# Patient Record
Sex: Male | Born: 1975 | Race: White | Hispanic: No | Marital: Married | Smoking: Never smoker
Health system: Southern US, Community
[De-identification: ages and names within clinical notes are randomized; demographics above are authoritative.]

## PROBLEM LIST (undated history)

## (undated) DIAGNOSIS — B3322 Viral myocarditis: Secondary | ICD-10-CM

## (undated) HISTORY — PX: FEMUR SURGERY: SHX943

---
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
## 2005-11-19 HISTORY — PX: FEMUR SURGERY: SHX943

## 2006-06-21 ENCOUNTER — Inpatient Hospital Stay (HOSPITAL_COMMUNITY): Admission: EM | Admit: 2006-06-21 | Discharge: 2006-06-26 | Payer: Self-pay | Admitting: Emergency Medicine

## 2011-11-05 ENCOUNTER — Emergency Department (HOSPITAL_COMMUNITY)
Admission: EM | Admit: 2011-11-05 | Discharge: 2011-11-05 | Disposition: A | Discharge status: Home / Self Care | Payer: BC Managed Care – PPO | Attending: Emergency Medicine | Admitting: Emergency Medicine

## 2011-11-05 ENCOUNTER — Emergency Department (HOSPITAL_COMMUNITY): Payer: BC Managed Care – PPO

## 2011-11-05 ENCOUNTER — Encounter: Payer: Self-pay | Admitting: Emergency Medicine

## 2011-11-05 ENCOUNTER — Other Ambulatory Visit: Payer: Self-pay

## 2011-11-05 DIAGNOSIS — R079 Chest pain, unspecified: Secondary | ICD-10-CM | POA: Insufficient documentation

## 2011-11-05 HISTORY — DX: Viral myocarditis: B33.22

## 2011-11-05 LAB — CBC
HCT: 42 % (ref 39.0–52.0)
MCHC: 35.2 g/dL (ref 30.0–36.0)
MCV: 84.7 fL (ref 78.0–100.0)
RBC: 4.96 MIL/uL (ref 4.22–5.81)
RDW: 12.7 % (ref 11.5–15.5)
WBC: 6.2 10*3/uL (ref 4.0–10.5)

## 2011-11-05 LAB — BASIC METABOLIC PANEL
Creatinine, Ser: 0.83 mg/dL (ref 0.50–1.35)
GFR calc non Af Amer: >90 mL/min (ref >90)
Glucose, Bld: 84 mg/dL (ref 70–99)
Potassium: 4.1 mEq/L (ref 3.5–5.1)

## 2011-11-05 LAB — DIFFERENTIAL
Basophils Relative: 0 % (ref 0–1)
Eosinophils Absolute: 0.1 10*3/uL (ref 0.0–0.7)
Lymphocytes Relative: 39 % (ref 12–46)
Lymphs Abs: 2.4 10*3/uL (ref 0.7–4.0)
Monocytes Absolute: 0.3 10*3/uL (ref 0.1–1.0)
Neutrophils Relative %: 55 % (ref 43–77)

## 2011-11-05 LAB — TROPONIN I: Troponin I: <0.3 ng/mL (ref <0.30)

## 2011-11-05 MED ORDER — KETOROLAC TROMETHAMINE 30 MG/ML IJ SOLN
30.0000 mg | Freq: Once | INTRAMUSCULAR | Status: AC
  Administered 2011-11-05: 30 mg via INTRAMUSCULAR
  Filled 2011-11-05: qty 1

## 2011-11-05 NOTE — ED Notes (Signed)
Notified EDP Belfi about patient pain of 8/10

## 2011-11-05 NOTE — ED Provider Notes (Signed)
History     CSN: [id number] Arrival date & time: 11/05/2011 10:58 AM   First MD Initiated Contact with Patient 11/05/11 1349      Chief Complaint  Patient presents with  . Chest Pain    (Consider location/radiation/quality/duration/timing/severity/associated sxs/prior treatment) HPI Comments: Pt presents with sharp pain to left side of chest, under left breast, nonradiating.  Last seconds, but comes and goes frequently.  Has had these pains off/on for about 15 years, but has been more frequent over last 3 days,  Same type of pain, but more frequent.  No pleuritic pain, no SOB.  No fevers/cough/congestion/abd pain.  Saw cardiologist about 10 years ago for same type of pain and had neg stress.  Patient is a 35 y.o. male presenting with chest pain. The history is provided by the patient.  Chest Pain The chest pain began 3 - 5 days ago. Pertinent negatives for primary symptoms include no fever, no fatigue, no shortness of breath, no cough, no abdominal pain, no nausea, no vomiting and no dizziness.  Pertinent negatives for associated symptoms include no diaphoresis, no numbness and no weakness.     Past Medical History  Diagnosis Date  . Viral myocarditis     Past Surgical History  Procedure Date  . Femur surgery     History reviewed. No pertinent family history.  History  Substance Use Topics  . Smoking status: Never Smoker   . Smokeless tobacco: Not on file  . Alcohol Use:       Review of Systems  Constitutional: Negative for fever, chills, diaphoresis and fatigue.  HENT: Negative for congestion, rhinorrhea and sneezing.   Eyes: Negative.   Respiratory: Negative for cough, chest tightness and shortness of breath.   Cardiovascular: Positive for chest pain. Negative for leg swelling.  Gastrointestinal: Negative for nausea, vomiting, abdominal pain, diarrhea and blood in stool.  Genitourinary: Negative for frequency, hematuria, flank pain and difficulty urinating.    Musculoskeletal: Negative for back pain and arthralgias.  Skin: Negative for rash.  Neurological: Negative for dizziness, speech difficulty, weakness, numbness and headaches.    Allergies  Penicillins  Home Medications   Current Outpatient Rx  Name Route Sig Dispense Refill  . ASPIRIN 325 MG PO TABS Oral Take 650 mg by mouth once.        BP 117/70  Pulse 108  Temp(Src) 97.9 F (36.6 C) (Oral)  Resp 18  SpO2 100%  Physical Exam  Constitutional: He is oriented to person, place, and time. He appears well-developed and well-nourished.  HENT:  Head: Normocephalic and atraumatic.  Eyes: Pupils are equal, round, and reactive to light.  Neck: Normal range of motion. Neck supple.  Cardiovascular: Normal rate, regular rhythm and normal heart sounds.   Pulmonary/Chest: Effort normal and breath sounds normal. No respiratory distress. He has no wheezes. He has no rales. He exhibits tenderness.       Mildly reproducible on palpation under left breast  Abdominal: Soft. Bowel sounds are normal. There is no tenderness. There is no rebound and no guarding.  Musculoskeletal: Normal range of motion. He exhibits no edema.  Lymphadenopathy:    He has no cervical adenopathy.  Neurological: He is alert and oriented to person, place, and time.  Skin: Skin is warm and dry. No rash noted.  Psychiatric: He has a normal mood and affect.    ED Course  Procedures (including critical care time)  Results for orders placed during the hospital encounter of 11/05/11  CBC  Component Value Range   WBC 6.2  4.0 - 10.5 (K/uL)   RBC 4.96  4.22 - 5.81 (MIL/uL)   Hemoglobin 14.8  13.0 - 17.0 (g/dL)   HCT 16.1  09.6 - 04.5 (%)   MCV 84.7  78.0 - 100.0 (fL)   MCH 29.8  26.0 - 34.0 (pg)   MCHC 35.2  30.0 - 36.0 (g/dL)   RDW 40.9  81.1 - 91.4 (%)   Platelets 305  150 - 400 (K/uL)  DIFFERENTIAL      Component Value Range   Neutrophils Relative 55  43 - 77 (%)   Neutro Abs 3.4  1.7 - 7.7 (K/uL)    Lymphocytes Relative 39  12 - 46 (%)   Lymphs Abs 2.4  0.7 - 4.0 (K/uL)   Monocytes Relative 5  3 - 12 (%)   Monocytes Absolute 0.3  0.1 - 1.0 (K/uL)   Eosinophils Relative 1  0 - 5 (%)   Eosinophils Absolute 0.1  0.0 - 0.7 (K/uL)   Basophils Relative 0  0 - 1 (%)   Basophils Absolute 0.0  0.0 - 0.1 (K/uL)  BASIC METABOLIC PANEL      Component Value Range   Sodium 139  135 - 145 (mEq/L)   Potassium 4.1  3.5 - 5.1 (mEq/L)   Chloride 103  96 - 112 (mEq/L)   CO2 27  19 - 32 (mEq/L)   Glucose, Bld 84  70 - 99 (mg/dL)   BUN 11  6 - 23 (mg/dL)   Creatinine, Ser 7.82  0.50 - 1.35 (mg/dL)   Calcium 9.8  8.4 - 95.6 (mg/dL)   GFR calc non Af Amer >90  >90 (mL/min)   GFR calc Af Amer >90  >90 (mL/min)  TROPONIN I      Component Value Range   Troponin I <0.30  <0.30 (ng/mL)   Dg Chest 2 View  11/05/2011  *RADIOLOGY REPORT*  Clinical Data: Chest pain.  CHEST - 2 VIEW  Comparison: 82,007.  Findings: Lungs are clear.  Heart and mediastinal structures are normal. The osseous structures are unremarkable.  IMPRESSION: No evidence for active chest disease.  Original Report Authenticated By: Rolla Plate, M.D.    Date: 11/05/2011  Rate: 63  Rhythm: normal sinus rhythm  QRS Axis: normal  Intervals: normal  ST/T Wave abnormalities: normal  Conduction Disutrbances:none  Narrative Interpretation:   Old EKG Reviewed: unchanged     1. Chest pain       MDM  Doubt ACS, nothing to suggest PE, nothing to suggest myocarditis.  Will have pt f/u with PMD at [facility]        Rolan Bucco, MD 11/05/11 [phone]

## 2011-11-05 NOTE — ED Notes (Signed)
Pt c/o chest pain onset 3 days ago, states has had pain to same area in past, hurting under L breast area, denies radiation of pain.

## 2011-11-05 NOTE — ED Notes (Signed)
Pt in no acute distress. 

## 2012-01-26 ENCOUNTER — Ambulatory Visit (INDEPENDENT_AMBULATORY_CARE_PROVIDER_SITE_OTHER): Payer: BC Managed Care – PPO | Admitting: Family Medicine

## 2012-01-26 VITALS — BP 120/81 | HR 69 | Temp 98.5°F | Resp 16 | Ht 66.5 in | Wt 159.0 lb

## 2012-01-26 DIAGNOSIS — J069 Acute upper respiratory infection, unspecified: Secondary | ICD-10-CM

## 2012-01-26 MED ORDER — FLUTICASONE PROPIONATE 50 MCG/ACT NA SUSP: 2.0000 | Freq: Every day | NASAL | Status: AC

## 2012-01-26 NOTE — Progress Notes (Signed)
  Subjective:    Patient ID: Gabriel Brooks, male    DOB: 1976-11-02, 36 y.o.   MRN: [id number]  HPI 36 yo male with URI symptoms for 3 days. Bodyaches, sinus congestion, sinus pressure, no fever.  No cough but very sore throat and headache.  No ear pain.  Ears do feel full.  Tried tylenol cold and sinus.     Review of Systems Negative except as per HPI     Objective:   Physical Exam  Constitutional: He appears well-developed. No distress.  HENT:  Right Ear: Tympanic membrane, external ear and ear canal normal. Tympanic membrane is not injected, not scarred, not perforated, not erythematous, not retracted and not bulging.  Left Ear: Tympanic membrane, external ear and ear canal normal. Tympanic membrane is not injected, not scarred, not perforated, not erythematous, not retracted and not bulging.  Nose: Mucosal edema present. No rhinorrhea. Right sinus exhibits no maxillary sinus tenderness and no frontal sinus tenderness. Left sinus exhibits no maxillary sinus tenderness and no frontal sinus tenderness.  Mouth/Throat: Uvula is midline and mucous membranes are normal. Posterior oropharyngeal erythema present. No oropharyngeal exudate or tonsillar abscesses.  Cardiovascular: Normal rate, regular rhythm, normal heart sounds and intact distal pulses.   No murmur heard. Pulmonary/Chest: Effort normal and breath sounds normal. No respiratory distress. He has no wheezes. He has no rales.  Lymphadenopathy:       Head (right side): No submandibular and no preauricular adenopathy present.       Head (left side): No submandibular and no preauricular adenopathy present.       Right cervical: No superficial cervical and no posterior cervical adenopathy present.      Left cervical: No superficial cervical and no posterior cervical adenopathy present.       Right: No supraclavicular adenopathy present.       Left: No supraclavicular adenopathy present.  Skin: Skin is warm and dry.            Assessment & Plan:  URI - flonase, sudafed, fluids.  If not starting to feel better in 48-72 hours can call and we can call out abx.

## 2012-08-13 ENCOUNTER — Telehealth: Payer: Self-pay

## 2012-08-13 NOTE — Telephone Encounter (Signed)
He had a Tdap in 2008 but that is all we have record of. Chart put back in PA box to make copies and send to patient.

## 2012-08-13 NOTE — Telephone Encounter (Signed)
Chart pulled to PA pool at nurse station DOS 06/16/11

## 2012-08-13 NOTE — Telephone Encounter (Signed)
Patient is calling to see if he has an immunization record with [place] because he needs to send his records to school. He cannot remember if he had immunization done here and would like to verify before he requests those records.  Best [phone]

## 2012-08-14 NOTE — Telephone Encounter (Signed)
Faxed to [phone] and [phone]

## 2015-02-01 ENCOUNTER — Ambulatory Visit: Payer: BLUE CROSS/BLUE SHIELD

## 2016-03-14 DIAGNOSIS — L84 Corns and callosities: Secondary | ICD-10-CM | POA: Diagnosis not present

## 2016-03-14 DIAGNOSIS — L821 Other seborrheic keratosis: Secondary | ICD-10-CM | POA: Diagnosis not present

## 2016-05-14 DIAGNOSIS — B070 Plantar wart: Secondary | ICD-10-CM | POA: Diagnosis not present

## 2016-05-14 DIAGNOSIS — M2141 Flat foot [pes planus] (acquired), right foot: Secondary | ICD-10-CM | POA: Diagnosis not present

## 2016-05-14 DIAGNOSIS — M2142 Flat foot [pes planus] (acquired), left foot: Secondary | ICD-10-CM | POA: Diagnosis not present

## 2016-05-14 MED FILL — CIMETIDINE 300 MG TABLET: 300 | 30 days supply | Qty: 90 | Fill #0

## 2016-06-04 DIAGNOSIS — M2141 Flat foot [pes planus] (acquired), right foot: Secondary | ICD-10-CM | POA: Diagnosis not present

## 2016-06-04 DIAGNOSIS — B070 Plantar wart: Secondary | ICD-10-CM | POA: Diagnosis not present

## 2016-06-04 DIAGNOSIS — M2142 Flat foot [pes planus] (acquired), left foot: Secondary | ICD-10-CM | POA: Diagnosis not present

## 2016-06-26 DIAGNOSIS — M76821 Posterior tibial tendinitis, right leg: Secondary | ICD-10-CM | POA: Diagnosis not present

## 2016-06-26 DIAGNOSIS — M2141 Flat foot [pes planus] (acquired), right foot: Secondary | ICD-10-CM | POA: Diagnosis not present

## 2016-06-26 DIAGNOSIS — M2142 Flat foot [pes planus] (acquired), left foot: Secondary | ICD-10-CM | POA: Diagnosis not present

## 2016-06-26 DIAGNOSIS — M76822 Posterior tibial tendinitis, left leg: Secondary | ICD-10-CM | POA: Diagnosis not present

## 2016-06-26 DIAGNOSIS — B070 Plantar wart: Secondary | ICD-10-CM | POA: Diagnosis not present

## 2016-12-16 ENCOUNTER — Ambulatory Visit (INDEPENDENT_AMBULATORY_CARE_PROVIDER_SITE_OTHER): Payer: Self-pay | Admitting: Family

## 2016-12-16 VITALS — BP 124/80 | HR 70 | Temp 98.4°F | Wt 160.6 lb

## 2016-12-16 DIAGNOSIS — M5010 Cervical disc disorder with radiculopathy, unspecified cervical region: Secondary | ICD-10-CM

## 2016-12-16 DIAGNOSIS — M542 Cervicalgia: Secondary | ICD-10-CM

## 2016-12-16 DIAGNOSIS — M25512 Pain in left shoulder: Secondary | ICD-10-CM

## 2016-12-16 MED ORDER — PREDNISONE 20 MG PO TABS: ORAL_TABLET | ORAL | 0 refills | Status: DC

## 2016-12-16 MED ORDER — HYDROCODONE-ACETAMINOPHEN 10-325 MG PO TABS: 1.0000 | ORAL_TABLET | Freq: Three times a day (TID) | ORAL | 0 refills | Status: DC | PRN

## 2016-12-16 NOTE — Progress Notes (Signed)
Subjective:     Patient ID: Gabriel Brooks, male   DOB: 1976-08-05, 41 y.o.   MRN: [id number]  HPI 41 year old male is in today with c/o neck, left shoulder, and upper back pain x 2 weeks and worsening. Denies any injury. Reports pain is a 10/10. Occasionally, he is able to find a position that allows the pain to decrease to 6/10 and tolerable. Pain worse at night. Feels that he is beginning to experience pain to the left upper arm recently that was not present previously. In his career, does some heavy lifting but no acute injury. Has taken Ibuprofen that helps but does not relieve his symptoms.   Review of Systems  Constitutional: Negative.   Respiratory: Negative.   Cardiovascular: Negative.   Gastrointestinal: Negative.   Musculoskeletal: Positive for arthralgias, back pain, neck pain and neck stiffness.  Skin: Negative.   Neurological: Negative.  Negative for dizziness and numbness.  Psychiatric/Behavioral: Negative.    Past Medical History:  Diagnosis Date  . Viral myocarditis     Social History   Social History  . Marital status: Single    Spouse name: N/A  . Number of children: N/A  . Years of education: N/A   Occupational History  . Not on file.   Social History Main Topics  . Smoking status: Never Smoker  . Smokeless tobacco: Never Used  . Alcohol use Yes  . Drug use: No  . Sexual activity: Not on file   Other Topics Concern  . Not on file   Social History Narrative  . No narrative on file    Past Surgical History:  Procedure Laterality Date  . FEMUR SURGERY      No family history on file.  Allergies  Allergen Reactions  . Penicillins Other (See Comments)    Unknown childhood reaction    No current outpatient prescriptions on file prior to visit.   No current facility-administered medications on file prior to visit.     BP 124/80 (BP Location: Left Arm, Patient Position: Sitting, Cuff Size: Normal)   Pulse 70   Temp 98.4 F (36.9 C) (Oral)    Wt 160 lb 9.6 oz (72.8 kg)   SpO2 96%   BMI 25.53 kg/m chart    Objective:   Physical Exam  Constitutional: He is oriented to person, place, and time. He appears well-developed and well-nourished.  Neck: Neck supple. Muscular tenderness present. No spinous process tenderness present. Decreased range of motion present.    Cardiovascular: Normal rate, regular rhythm and normal heart sounds.   Pulmonary/Chest: Effort normal and breath sounds normal.  Abdominal: Soft. Bowel sounds are normal.  Musculoskeletal: He exhibits no tenderness.  Neurological: He is oriented to person, place, and time. He has normal reflexes. No cranial nerve deficit.  Skin: Skin is warm and dry.  Psychiatric: He has a normal mood and affect.       Assessment:     Gabriel Brooks was seen today for shoulder pain, neck pain and back pain.  Diagnoses and all orders for this visit:  Neck pain  Cervical disc disorder with radiculopathy of cervical region  Acute pain of left shoulder  Other orders -     predniSONE (DELTASONE) 20 MG tablet; 3 tabs po qam x 3 days, 2 tabs po qam x 3 days, 2 tabs po qam x 3 days -     HYDROcodone-acetaminophen (NORCO) 10-325 MG tablet; Take 1 tablet by mouth every 8 (eight) hours as needed.  Plan:     If symptoms persist, will obtain an xray or the cervical spine. Ultimately may need Physical Therapy and an MRI. Conservatively, we will treat will medication, ice, rest. Advised patient to establish a relationship with a PCP/

## 2016-12-16 NOTE — Patient Instructions (Signed)
Cervical Radiculopathy Introduction Cervical radiculopathy means that a nerve in the neck is pinched or bruised. This can cause pain or loss of feeling (numbness) that runs from your neck to your arm and fingers. Follow these instructions at home: Managing pain  Take over-the-counter and prescription medicines only as told by your doctor.  If directed, put ice on the injured or painful area.  Put ice in a plastic bag.  Place a towel between your skin and the bag.  Leave the ice on for 20 minutes, 2-3 times per day.  If ice does not help, you can try using heat. Take a warm shower or warm bath, or use a heat pack as told by your doctor.  You may try a gentle neck and shoulder massage. Activity  Rest as needed. Follow instructions from your doctor about any activities to avoid.  Do exercises as told by your doctor or physical therapist. General instructions  If you were given a soft collar, wear it as told by your doctor.  Use a flat pillow when you sleep.  Keep all follow-up visits as told by your doctor. This is important. Contact a doctor if:  Your condition does not improve with treatment. Get help right away if:  Your pain gets worse and is not controlled with medicine.  You lose feeling or feel weak in your hand, arm, face, or leg.  You have a fever.  You have a stiff neck.  You cannot control when you poop or pee (have incontinence).  You have trouble with walking, balance, or talking. This information is not intended to replace advice given to you by your health care provider. Make sure you discuss any questions you have with your health care provider. Document Released: 10/25/2011 Document Revised: 04/12/2016 Document Reviewed: 12/30/2014  2017 Elsevier  

## 2016-12-23 DIAGNOSIS — S93492A Sprain of other ligament of left ankle, initial encounter: Secondary | ICD-10-CM | POA: Diagnosis not present

## 2018-08-13 MED FILL — BETAMETHASONE DP 0.05% CRM: 0.05 | 20 days supply | Qty: 45 | Fill #0

## 2019-04-29 MED FILL — FLUOCINONIDE 0.05% CREAM: 0.05 | 21 days supply | Qty: 60 | Fill #0

## 2019-09-15 ENCOUNTER — Other Ambulatory Visit: Payer: Self-pay

## 2019-09-15 DIAGNOSIS — Z20822 Contact with and (suspected) exposure to covid-19: Secondary | ICD-10-CM

## 2019-09-17 LAB — NOVEL CORONAVIRUS, NAA: SARS-CoV-2, NAA: NOT DETECTED

## 2020-01-30 ENCOUNTER — Ambulatory Visit: Payer: Self-pay | Attending: Internal Medicine

## 2020-01-30 DIAGNOSIS — Z23 Encounter for immunization: Secondary | ICD-10-CM

## 2020-01-30 NOTE — Progress Notes (Signed)
   Covid-19 Vaccination Clinic  Name:  Gabriel Brooks    MRN: [id number] DOB: 02-16-1976  01/30/2020  Gabriel Brooks was observed post Covid-19 immunization for 15 minutes without incident. He was provided with Vaccine Information Sheet and instruction to access the V-Safe system.   Gabriel Brooks was instructed to call 911 with any severe reactions post vaccine: Marland Kitchen Difficulty breathing  . Swelling of face and throat  . A fast heartbeat  . A bad rash all over body  . Dizziness and weakness   Immunizations Administered    Name Date Dose VIS Date Route   Pfizer COVID-19 Vaccine 01/30/2020 10:31 AM 0.3 mL 10/30/2019 Intramuscular   Manufacturer: [place]   Lot: [id number]   [place]: [id number]

## 2020-02-22 ENCOUNTER — Ambulatory Visit: Payer: Self-pay

## 2020-02-27 ENCOUNTER — Ambulatory Visit: Payer: Self-pay | Attending: Internal Medicine

## 2020-02-27 DIAGNOSIS — Z23 Encounter for immunization: Secondary | ICD-10-CM

## 2020-02-27 NOTE — Progress Notes (Signed)
   Covid-19 Vaccination Clinic  Name:  Gabriel Brooks    MRN: [id number] DOB: [birth year]  02/27/2020  [name] was observed post Covid-19 immunization for 15 minutes without incident. He was provided with Vaccine Information Sheet and instruction to access the V-Safe system.   [name] was instructed to call 911 with any severe reactions post vaccine: Marland Kitchen Difficulty breathing  . Swelling of face and throat  . A fast heartbeat  . A bad rash all over body  . Dizziness and weakness   Immunizations Administered    Name Date Dose VIS Date Route   Pfizer COVID-19 Vaccine 02/27/2020  8:58 AM 0.3 mL 10/30/2019 Intramuscular   Manufacturer: [place]   Lot: [id number]   [place]: [id number]

## 2020-05-05 DIAGNOSIS — Z0000 Encounter for general adult medical examination without abnormal findings: Secondary | ICD-10-CM | POA: Diagnosis not present

## 2020-05-05 DIAGNOSIS — E162 Hypoglycemia, unspecified: Secondary | ICD-10-CM | POA: Diagnosis not present

## 2020-05-05 DIAGNOSIS — Z1331 Encounter for screening for depression: Secondary | ICD-10-CM | POA: Diagnosis not present

## 2020-05-05 DIAGNOSIS — Z23 Encounter for immunization: Secondary | ICD-10-CM | POA: Diagnosis not present

## 2020-05-26 DIAGNOSIS — S338XXD Sprain of other parts of lumbar spine and pelvis, subsequent encounter: Secondary | ICD-10-CM | POA: Diagnosis not present

## 2020-05-30 ENCOUNTER — Ambulatory Visit (INDEPENDENT_AMBULATORY_CARE_PROVIDER_SITE_OTHER): Payer: 59 | Admitting: Adult Health

## 2020-05-30 ENCOUNTER — Encounter: Payer: Self-pay | Admitting: Adult Health

## 2020-05-30 ENCOUNTER — Other Ambulatory Visit: Payer: Self-pay

## 2020-05-30 VITALS — BP 136/85 | HR 63 | Wt 160.0 lb

## 2020-05-30 DIAGNOSIS — F909 Attention-deficit hyperactivity disorder, unspecified type: Secondary | ICD-10-CM

## 2020-05-30 MED ORDER — AMPHETAMINE-DEXTROAMPHETAMINE 20 MG PO TABS: 20.0000 mg | ORAL_TABLET | Freq: Every day | ORAL | 0 refills | Status: DC

## 2020-05-30 MED FILL — AMPHETAMINE-DEXTROAMPHETAMI: 20 | 30 days supply | Qty: 30 | Fill #0

## 2020-05-30 NOTE — Progress Notes (Signed)
[facility] MD/PA/NP Initial Note  05/30/2020 4:05 PM Gabriel Brooks  MRN:  [id number]  Chief Complaint:  Chief Complaint    ADHD      HPI:   Describes mood today as "ok". Pleasant. Mood symptoms - depression, anxiety, and irritability. Wanting to discuss ADHD. Stable interest and motivation. Taking medications as prescribed.  Energy levels stable - reports periods of low energy. Active, does not have a regular exercise routine.  Enjoys some usual interests and activities. Married. Lives with wife of 14 years and 2 children 11 and 8. Family local. Spending time with family. Appetite adequate. Weight stable - 160 pounds Sleeps well most nights. Averages 6 and 1/2 hours. Difficulties with focus and concentration. Has had struggles since childhood. Was unable to finish college with one class left. Has left work situations that required too many details. Not good at finishing the last 15% of tasks I need to complete. Stating "I am missing a lot". Completing tasks. Managing aspects of household. Works full-time - Nature conservation officer. Travels every other week. Denies SI or HI. Denies AH or VH.  Previous medication trials: Denies  Visit Diagnosis:    ICD-10-CM   1. Attention deficit hyperactivity disorder (ADHD), unspecified ADHD type  F90.9 amphetamine-dextroamphetamine (ADDERALL) 20 MG tablet    Past Psychiatric History: Denies psychiatric hospitalization.  Past Medical History:  Past Medical History:  Diagnosis Date  . Viral myocarditis     Past Surgical History:  Procedure Laterality Date  . FEMUR SURGERY      Family Psychiatric History: Denies psychiatric hospitalization.  Family History: No family history on file.  Social History:  Social History   Socioeconomic History  . Marital status: Married    Spouse name: Not on file  . Number of children: Not on file  . Years of education: Not on file  . Highest education level: Not on file  Occupational History   . Not on file  Tobacco Use  . Smoking status: Never Smoker  . Smokeless tobacco: Never Used  Substance and Sexual Activity  . Alcohol use: Yes  . Drug use: No  . Sexual activity: Not on file  Other Topics Concern  . Not on file  Social History Narrative  . Not on file   Social Determinants of Health   Financial Resource Strain:   . Difficulty of Paying Living Expenses:   Food Insecurity:   . Worried About Charity fundraiser in the Last Year:   . Arboriculturist in the Last Year:   Transportation Needs:   . Film/video editor (Medical):   Marland Kitchen Lack of Transportation (Non-Medical):   Physical Activity:   . Days of Exercise per Week:   . Minutes of Exercise per Session:   Stress:   . Feeling of Stress :   Social Connections:   . Frequency of Communication with Friends and Family:   . Frequency of Social Gatherings with Friends and Family:   . Attends Religious Services:   . Active Member of Clubs or Organizations:   . Attends Archivist Meetings:   Marland Kitchen Marital Status:     Allergies:  Allergies  Allergen Reactions  . Penicillins Other (See Comments)    Unknown childhood reaction    Metabolic Disorder Labs: No results found for: HGBA1C, MPG No results found for: PROLACTIN No results found for: CHOL, TRIG, HDL, CHOLHDL, VLDL, LDLCALC No results found for: TSH  Therapeutic Level Labs: No results found  for: LITHIUM No results found for: VALPROATE No components found for:  CBMZ  Current Medications: Current Outpatient Medications  Medication Sig Dispense Refill  . amphetamine-dextroamphetamine (ADDERALL) 20 MG tablet Take 1 tablet (20 mg total) by mouth daily. 30 tablet 0  . HYDROcodone-acetaminophen (NORCO) 10-325 MG tablet Take 1 tablet by mouth every 8 (eight) hours as needed. 30 tablet 0  . predniSONE (DELTASONE) 20 MG tablet 3 tabs po qam x 3 days, 2 tabs po qam x 3 days, 2 tabs po qam x 3 days 18 tablet 0   No current facility-administered  medications for this visit.    Medication Side Effects: none  Orders placed this visit:  No orders of the defined types were placed in this encounter.   Psychiatric Specialty Exam:  Review of Systems  Musculoskeletal: Negative for gait problem.  Neurological: Negative for tremors.  Psychiatric/Behavioral:       Please refer to HPI    Blood pressure 136/85, pulse 63, weight 160 lb (72.6 kg).Body mass index is 25.44 kg/m.  General Appearance: Casual, Neat and Well Groomed  Eye Contact:  Good  Speech:  Clear and Coherent and Normal Rate  Volume:  Normal  Mood:  Euthymic  Affect:  Appropriate and Congruent  Thought Process:  Coherent and Descriptions of Associations: Intact  Orientation:  Full (Time, Place, and Person)  Thought Content: Logical   Suicidal Thoughts:  No  Homicidal Thoughts:  No  Memory:  WNL  Judgement:  Good  Insight:  Good  Psychomotor Activity:  Normal  Concentration:  Concentration: Good  Recall:  Good  Fund of Knowledge: Good  Language: Good  Assets:  Communication Skills Desire for Improvement Financial Resources/Insurance Housing Intimacy Leisure Time Physical Health Resilience Social Support Talents/Skills Transportation Vocational/Educational  ADL's:  Intact  Cognition: WNL  Prognosis:  Good   Screenings: [facility] ADHD testing completed.  Receiving Psychotherapy: No   Treatment Plan/Recommendations:  Plan:  PDMP reviewed  1. Add Adderall 20mg  daily  [facility] ADHD test 44/58 - ADHD likely  Meets DSM 5 ADHD criteria for ADHD  RTC 4 weeks  Patient advised to contact office with any questions, adverse effects, or acute worsening in signs and symptoms.  Discussed potential benefits, risks, and side effects of stimulants with patient to include increased heart rate, palpitations, insomnia, increased anxiety, increased irritability, or decreased appetite.  Instructed patient to contact office if experiencing any  significant tolerability issues.  Greater than 50% of face to face time with patient was spent on counseling and coordination of care. We discussed ADHD, medications, diet, vitamin supplementation, and ADDitude website. ADHD testing completed during visit.     Aloha Gell, NP

## 2020-05-31 DIAGNOSIS — S338XXD Sprain of other parts of lumbar spine and pelvis, subsequent encounter: Secondary | ICD-10-CM | POA: Diagnosis not present

## 2020-06-06 DIAGNOSIS — S338XXD Sprain of other parts of lumbar spine and pelvis, subsequent encounter: Secondary | ICD-10-CM | POA: Diagnosis not present

## 2020-06-13 DIAGNOSIS — S338XXD Sprain of other parts of lumbar spine and pelvis, subsequent encounter: Secondary | ICD-10-CM | POA: Diagnosis not present

## 2020-06-16 ENCOUNTER — Emergency Department: Payer: 59

## 2020-06-16 ENCOUNTER — Emergency Department
Admission: EM | Admit: 2020-06-16 | Discharge: 2020-06-16 | Disposition: A | Discharge status: Home / Self Care | Payer: 59 | Attending: Emergency Medicine | Admitting: Emergency Medicine

## 2020-06-16 ENCOUNTER — Other Ambulatory Visit: Payer: Self-pay

## 2020-06-16 DIAGNOSIS — N200 Calculus of kidney: Secondary | ICD-10-CM | POA: Insufficient documentation

## 2020-06-16 DIAGNOSIS — N50819 Testicular pain, unspecified: Secondary | ICD-10-CM | POA: Diagnosis not present

## 2020-06-16 DIAGNOSIS — N4289 Other specified disorders of prostate: Secondary | ICD-10-CM | POA: Diagnosis not present

## 2020-06-16 DIAGNOSIS — I861 Scrotal varices: Secondary | ICD-10-CM | POA: Diagnosis not present

## 2020-06-16 DIAGNOSIS — N132 Hydronephrosis with renal and ureteral calculous obstruction: Secondary | ICD-10-CM | POA: Diagnosis not present

## 2020-06-16 DIAGNOSIS — Z79899 Other long term (current) drug therapy: Secondary | ICD-10-CM | POA: Diagnosis not present

## 2020-06-16 DIAGNOSIS — N50811 Right testicular pain: Secondary | ICD-10-CM | POA: Diagnosis not present

## 2020-06-16 DIAGNOSIS — R1032 Left lower quadrant pain: Secondary | ICD-10-CM | POA: Diagnosis present

## 2020-06-16 DIAGNOSIS — N50812 Left testicular pain: Secondary | ICD-10-CM | POA: Diagnosis not present

## 2020-06-16 LAB — COMPREHENSIVE METABOLIC PANEL
ALT: 33 U/L (ref 0–44)
AST: 25 U/L (ref 15–41)
Albumin: 4.6 g/dL (ref 3.5–5.0)
Alkaline Phosphatase: 40 U/L (ref 38–126)
Anion gap: 10 (ref 5–15)
BUN: 16 mg/dL (ref 6–20)
CO2: 22 mmol/L (ref 22–32)
Calcium: 9 mg/dL (ref 8.9–10.3)
Chloride: 108 mmol/L (ref 98–111)
Creatinine, Ser: 0.96 mg/dL (ref 0.61–1.24)
GFR calc Af Amer: >60 mL/min (ref >60)
GFR calc non Af Amer: >60 mL/min (ref >60)
Glucose, Bld: 103 mg/dL — ABNORMAL HIGH (ref 70–99)
Potassium: 3.5 mmol/L (ref 3.5–5.1)
Sodium: 140 mmol/L (ref 135–145)
Total Bilirubin: 0.7 mg/dL (ref 0.3–1.2)
Total Protein: 7.6 g/dL (ref 6.5–8.1)

## 2020-06-16 LAB — URINALYSIS, COMPLETE (UACMP) WITH MICROSCOPIC
Bacteria, UA: NONE SEEN
Bilirubin Urine: NEGATIVE
Glucose, UA: NEGATIVE mg/dL
Hgb urine dipstick: NEGATIVE
Ketones, ur: NEGATIVE mg/dL
Leukocytes,Ua: NEGATIVE
Nitrite: NEGATIVE
Protein, ur: NEGATIVE mg/dL
Specific Gravity, Urine: 1.015 (ref 1.005–1.030)
Squamous Epithelial / LPF: NONE SEEN (ref 0–5)
pH: 6 (ref 5.0–8.0)

## 2020-06-16 LAB — CBC
HCT: 39.3 % (ref 39.0–52.0)
Hemoglobin: 14.1 g/dL (ref 13.0–17.0)
MCH: 30.1 pg (ref 26.0–34.0)
MCHC: 35.9 g/dL (ref 30.0–36.0)
MCV: 84 fL (ref 80.0–100.0)
Platelets: 347 10*3/uL (ref 150–400)
RBC: 4.68 MIL/uL (ref 4.22–5.81)
RDW: 12.6 % (ref 11.5–15.5)
WBC: 11.6 10*3/uL — ABNORMAL HIGH (ref 4.0–10.5)
nRBC: 0 % (ref 0.0–0.2)

## 2020-06-16 LAB — LIPASE, BLOOD: Lipase: 41 U/L (ref 11–51)

## 2020-06-16 MED ORDER — ONDANSETRON HCL 4 MG PO TABS: 4.0000 mg | ORAL_TABLET | Freq: Three times a day (TID) | ORAL | 0 refills | Status: DC | PRN

## 2020-06-16 MED ORDER — SODIUM CHLORIDE 0.9% FLUSH
3.0000 mL | Freq: Once | INTRAVENOUS | Status: AC
  Administered 2020-06-16: 3 mL via INTRAVENOUS

## 2020-06-16 MED ORDER — ONDANSETRON HCL 4 MG/2ML IJ SOLN
4.0000 mg | Freq: Once | INTRAMUSCULAR | Status: AC
  Administered 2020-06-16: 4 mg via INTRAVENOUS
  Filled 2020-06-16: qty 2

## 2020-06-16 MED ORDER — FENTANYL CITRATE (PF) 100 MCG/2ML IJ SOLN
50.0000 ug | INTRAMUSCULAR | Status: DC | PRN
  Administered 2020-06-16: 50 ug via INTRAVENOUS
  Filled 2020-06-16: qty 2

## 2020-06-16 MED ORDER — LACTATED RINGERS IV BOLUS
1000.0000 mL | Freq: Once | INTRAVENOUS | Status: AC
  Administered 2020-06-16: 1000 mL via INTRAVENOUS

## 2020-06-16 MED ORDER — TAMSULOSIN HCL 0.4 MG PO CAPS: 0.4000 mg | ORAL_CAPSULE | Freq: Every day | ORAL | 0 refills | Status: AC

## 2020-06-16 MED ORDER — HYDROMORPHONE HCL 1 MG/ML IJ SOLN
0.5000 mg | Freq: Once | INTRAMUSCULAR | Status: AC
  Administered 2020-06-16: 0.5 mg via INTRAVENOUS
  Filled 2020-06-16: qty 1

## 2020-06-16 MED ORDER — KETOROLAC TROMETHAMINE 30 MG/ML IJ SOLN
30.0000 mg | Freq: Once | INTRAMUSCULAR | Status: AC
  Administered 2020-06-16: 30 mg via INTRAVENOUS
  Filled 2020-06-16: qty 1

## 2020-06-16 MED ORDER — OXYCODONE-ACETAMINOPHEN 5-325 MG PO TABS: 1.0000 | ORAL_TABLET | ORAL | 0 refills | Status: DC | PRN

## 2020-06-16 NOTE — ED Triage Notes (Signed)
Pt arrives via POV for reports of left lower abdominal pain radiating to groin that started 30 mins ago. Pt denies dysuria. Pt bent over moaning when walking to triage room, pt appears to be in severe pain. Skin warm and dry

## 2020-06-16 NOTE — ED Notes (Signed)
Pt at CT

## 2020-06-16 NOTE — ED Provider Notes (Signed)
[facility] Emergency Department Provider Note  ____________________________________________   First MD Initiated Contact with Patient 06/16/20 1656     (approximate)  I have reviewed the triage vital signs and the nursing notes.   HISTORY  Chief Complaint Abdominal Pain   HPI Gabriel Brooks is a 44 y.o. male with a past medical history of a femur fracture presents for assessment of acute left lower quadrant abdominal pain that began approximately an hour prior to arrival that he felt was rating into his groin.  He states he got better after he received p.o. analgesia in triage but otherwise he describes it as almost excruciating pain is ever felt and denies any other clear alleviating or factors prior to arrival.  States it also felt like it was radiating to his left lower back at times in the flank.  Endorses some hematuria 3 weeks ago but otherwise has not had any blood in his urine, dysuria,  abdominal pain but does state he has been going to PT for pelvic floor muscle weakness and has had some discomfort with that.  No recent fevers, chills, cough, nausea, diarrhea, rash, recent traumatic injuries.  He states he vomited once secondary to the pain has not since.  This was nonbloody nonbilious.  Denies any specific abdominal or GU trauma.  No recent lesions or rashes or penile discharge.  No prior similar episodes.  No other clearly fitting aggravating factors.         Past Medical History:  Diagnosis Date  . Viral myocarditis     There are no problems to display for this patient.   Past Surgical History:  Procedure Laterality Date  . FEMUR SURGERY      Prior to Admission medications   Medication Sig Start Date End Date Taking? Authorizing Provider  amphetamine-dextroamphetamine (ADDERALL) 20 MG tablet Take 1 tablet (20 mg total) by mouth daily. 05/30/20   Mozingo, Berdie Ogren, NP  ondansetron (ZOFRAN) 4 MG tablet Take 1 tablet (4 mg total) by  mouth every 8 (eight) hours as needed for up to 10 doses for nausea or vomiting. 06/16/20   Lucrezia Starch, MD  oxyCODONE-acetaminophen (PERCOCET) 5-325 MG tablet Take 1 tablet by mouth every 4 (four) hours as needed for severe pain. 06/16/20 06/16/21  Blake Divine, MD  predniSONE (DELTASONE) 20 MG tablet 3 tabs po qam x 3 days, 2 tabs po qam x 3 days, 2 tabs po qam x 3 days 12/16/16   Dutch Quint B, FNP  tamsulosin (FLOMAX) 0.4 MG CAPS capsule Take 1 capsule (0.4 mg total) by mouth daily for 5 days. 06/16/20 06/21/20  Lucrezia Starch, MD    Allergies Penicillins  History reviewed. No pertinent family history.  Social History Social History   Tobacco Use  . Smoking status: Never Smoker  . Smokeless tobacco: Never Used  Substance Use Topics  . Alcohol use: Yes  . Drug use: No    Review of Systems  Review of Systems  Constitutional: Negative for chills and fever.  HENT: Negative for sore throat.   Eyes: Negative for pain.  Respiratory: Negative for cough and stridor.   Cardiovascular: Negative for chest pain.  Gastrointestinal: Positive for abdominal pain and vomiting.  Genitourinary: Positive for hematuria.  Skin: Negative for rash.  Neurological: Negative for seizures, loss of consciousness and headaches.  Psychiatric/Behavioral: Negative for suicidal ideas.  All other systems reviewed and are negative.     ____________________________________________   PHYSICAL EXAM:  VITAL  SIGNS: ED Triage Vitals  Enc Vitals Group     BP 06/16/20 1545 (!) 146/103     Pulse Rate 06/16/20 1545 88     Resp 06/16/20 1545 18     Temp 06/16/20 1545 98.7 F (37.1 C)     Temp Source 06/16/20 1545 Oral     SpO2 06/16/20 1545 98 %     Weight 06/16/20 1546 160 lb (72.6 kg)     Height 06/16/20 1546 5\' 7"  (1.702 m)     Head Circumference --      Peak Flow --      Pain Score 06/16/20 1546 10     Pain Loc --      Pain Edu? --      Excl. in [place]? --    Vitals:   06/16/20 1545 06/16/20  1813  BP: (!) 146/103 127/80  Pulse: 88 63  Resp: 18 17  Temp: 98.7 F (37.1 C)   SpO2: 98% 99%   Physical Exam Vitals and nursing note reviewed.  Constitutional:      Appearance: He is well-developed.  HENT:     Head: Normocephalic and atraumatic.  Eyes:     Conjunctiva/sclera: Conjunctivae normal.  Cardiovascular:     Rate and Rhythm: Normal rate and regular rhythm.     Heart sounds: No murmur heard.   Pulmonary:     Effort: Pulmonary effort is normal. No respiratory distress.     Breath sounds: Normal breath sounds.  Abdominal:     Palpations: Abdomen is soft.     Tenderness: There is no abdominal tenderness.     Hernia: There is no hernia in the left inguinal area or right inguinal area.  Genitourinary:    Testes:        Right: Mass, tenderness or swelling not present.        Left: Mass, tenderness or swelling not present.     Epididymis:     Right: Not inflamed or enlarged.     Left: Not inflamed or enlarged.  Musculoskeletal:     Cervical back: Neck supple.  Lymphadenopathy:     Lower Body: No right inguinal adenopathy. No left inguinal adenopathy.  Skin:    General: Skin is warm and dry.  Neurological:     Mental Status: He is alert.      ____________________________________________   LABS (all labs ordered are listed, but only abnormal results are displayed)  Labs Reviewed  COMPREHENSIVE METABOLIC PANEL - Abnormal; Notable for the following components:      Result Value   Glucose, Bld 103 (*)    All other components within normal limits  CBC - Abnormal; Notable for the following components:   WBC 11.6 (*)    All other components within normal limits  URINALYSIS, COMPLETE (UACMP) WITH MICROSCOPIC - Abnormal; Notable for the following components:   Color, Urine YELLOW (*)    APPearance CLEAR (*)    All other components within normal limits  LIPASE, BLOOD    ____________________________________________  _______________________________________  RADIOLOGY   Official radiology report(s): CT Renal Stone Study  Result Date: 06/16/2020 CLINICAL DATA:  Left lower abdominal pain radiating to the groin EXAM: CT ABDOMEN AND PELVIS WITHOUT CONTRAST TECHNIQUE: Multidetector CT imaging of the abdomen and pelvis was performed following the standard protocol without IV contrast. COMPARISON:  None FINDINGS: Lower chest: No acute abnormality. Hepatobiliary: No focal liver abnormality is seen. No gallstones, gallbladder wall thickening, or biliary dilatation. Pancreas: Unremarkable. No pancreatic ductal  dilatation or surrounding inflammatory changes. Spleen: Normal in size without focal abnormality. Adrenals/Urinary Tract: Adrenal glands are normal. Punctate stone in the upper pole left kidney. Mild left hydronephrosis and proximal hydroureter, secondary to a 3 mm stone within the proximal left ureter about 4.5 cm distal to the left UPJ. Bladder is unremarkable. Punctate stone lower pole right kidney. Stomach/Bowel: Stomach is within normal limits. Appendix appears normal. No evidence of bowel wall thickening, distention, or inflammatory changes. Vascular/Lymphatic: No significant vascular findings are present. No enlarged abdominal or pelvic lymph nodes. Reproductive: Prostate calcification. Other: No abdominal wall hernia or abnormality. No abdominopelvic ascites. Musculoskeletal: No acute or significant osseous findings. IMPRESSION: 1. Mild left hydronephrosis and proximal hydroureter, secondary to a 3 mm stone within the proximal left ureter about 4.5 cm distal to the left UPJ. 2. Small intrarenal stones bilaterally. Electronically Signed   By: Donavan Foil M.D.   On: 06/16/2020 18:23   US SCROTUM W/DOPPLER  Result Date: 06/16/2020 CLINICAL DATA:  44 year old male with left side testicular pain x2 hours. EXAM: SCROTAL ULTRASOUND DOPPLER ULTRASOUND OF THE TESTICLES  TECHNIQUE: Complete ultrasound examination of the testicles, epididymis, and other scrotal structures was performed. Color and spectral Doppler ultrasound were also utilized to evaluate blood flow to the testicles. COMPARISON:  None. FINDINGS: Right testicle Measurements: 4.2 x 2.3 x 3.1 cm. No mass or microlithiasis visualized. Left testicle Measurements: 4.3 x 1.9 x 2.9 cm. No mass or microlithiasis visualized. Right epididymis:  Normal in size and appearance. Left epididymis:  Normal in size and appearance. Hydrocele:  None visualized. Varicocele: Left side pampiniform plexus veins are asymmetrically prominent compared to those on the right (image 37) but do not appear enlarged by Valsalva. Pulsed Doppler interrogation of both testes demonstrates normal low resistance arterial and venous waveforms bilaterally. IMPRESSION: Negative.  No evidence of testicular mass or torsion. Electronically Signed   By: Genevie Ann M.D.   On: 06/16/2020 18:17    ____________________________________________   PROCEDURES  Procedure(s) performed (including Critical Care):  Procedures   ____________________________________________   INITIAL IMPRESSION / ASSESSMENT AND PLAN / ED COURSE        Overall patient's history, exam, and a work-up is most consistent with symptomatic left-sided nephrolithiasis with mild hydro.  No evidence of diverticulitis, perinephric stranding, perforated viscus, testicular torsion, and given normal kidney function with UA that is not consistent with infected urine and patient tolerating p.o. stable vital signs I do believe he is stable for discharge with plan for close outpatient follow-up.  Flomax, Percocet prescribed.  Discharged stable condition.  Strict return precautions advised discussed.          ____________________________________________   FINAL CLINICAL IMPRESSION(S) / ED DIAGNOSES  Final diagnoses:  Testicular pain  Kidney stone     ED Discharge Orders          Ordered    ondansetron (ZOFRAN) 4 MG tablet  Every 8 hours PRN,   Status:  Discontinued     Reprint     06/16/20 1836    oxyCODONE-acetaminophen (PERCOCET) 5-325 MG tablet  Every 4 hours PRN     Discontinue  Reprint     06/16/20 1927    ondansetron (ZOFRAN) 4 MG tablet  Every 8 hours PRN     Discontinue  Reprint     06/16/20 1939    tamsulosin (FLOMAX) 0.4 MG CAPS capsule  Daily     Discontinue  Reprint     06/16/20 1939  Note:  This document was prepared using Dragon voice recognition software and may include unintentional dictation errors.   Lucrezia Starch, MD 06/16/20 2110

## 2020-06-16 NOTE — ED Notes (Signed)
Pt in [place]

## 2020-06-17 MED FILL — TAMSULOSIN HCL 0.4 MG CAP: 0.4 | 5 days supply | Qty: 5 | Fill #0

## 2020-06-17 MED FILL — OXYCODONE-APAP 5-325MG: 5-325 | 2 days supply | Qty: 12 | Fill #0

## 2020-06-17 MED FILL — ONDANSETRON HCL 4 MG TABS: 4 | 3 days supply | Qty: 10 | Fill #0

## 2020-06-27 ENCOUNTER — Other Ambulatory Visit: Payer: Self-pay

## 2020-06-27 ENCOUNTER — Ambulatory Visit (INDEPENDENT_AMBULATORY_CARE_PROVIDER_SITE_OTHER): Payer: 59 | Admitting: Adult Health

## 2020-06-27 ENCOUNTER — Encounter: Payer: Self-pay | Admitting: Adult Health

## 2020-06-27 DIAGNOSIS — F909 Attention-deficit hyperactivity disorder, unspecified type: Secondary | ICD-10-CM | POA: Diagnosis not present

## 2020-06-27 MED ORDER — AMPHETAMINE-DEXTROAMPHETAMINE 10 MG PO TABS: 10.0000 mg | ORAL_TABLET | Freq: Every day | ORAL | 0 refills | Status: DC

## 2020-06-27 MED FILL — AMPHETAMINE SALTS 10 MG: 10 | 30 days supply | Qty: 30 | Fill #0

## 2020-06-27 NOTE — Progress Notes (Signed)
IVERSON Brooks [id number] 08-20-1976 44 y.o.  Subjective:   Patient ID:  Gabriel Brooks is a 44 y.o. (DOB Mar 08, 1976) male.  Chief Complaint:  Chief Complaint  Patient presents with  . ADHD    HPI Gabriel Brooks presents to the office today for follow-up of ADHD.  Describes mood today as "ok". Pleasant. Mood symptoms - denies depression, anxiety, and irritability. Stating "I'm doing pretty good". Feels like addition of Adderall has been helpful overall. Has not been able to tolerate the 20mg  dose. Has been taking 1/2 to 1/4 tablet most days - has not taken some days. Work day is "better". Diagnosed with a kidney stone since last visit. Stable interest and motivation. Taking medications as prescribed.  Energy levels stable. Active, does not have a regular exercise routine.  Enjoys some usual interests and activities. Married. Lives with wife of 14 years and 2 children 11 and 8. Family local. Spending time with family. Appetite adequate. Weight stable - 160 pounds Sleeps well most nights. Averages 8 hours. Focus and concentration improved with Adderall. Completing tasks. Managing aspects of household. 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 Denies SI or HI. Denies AH or VH.  Previous medication trials: Denies    Review of Systems:  Review of Systems  Musculoskeletal: Negative for gait problem.  Neurological: Negative for tremors.  Psychiatric/Behavioral:       Please refer to HPI    Medications: I have reviewed the patient's current medications.  Current Outpatient Medications  Medication Sig Dispense Refill  . amphetamine-dextroamphetamine (ADDERALL) 10 MG tablet Take 1 tablet (10 mg total) by mouth daily with breakfast. 30 tablet 0  . amphetamine-dextroamphetamine (ADDERALL) 20 MG tablet Take 1 tablet (20 mg total) by mouth daily. 30 tablet 0  . ondansetron (ZOFRAN) 4 MG tablet Take 1 tablet (4 mg total) by mouth every 8 (eight) hours as  needed for up to 10 doses for nausea or vomiting. 10 tablet 0  . oxyCODONE-acetaminophen (PERCOCET) 5-325 MG tablet Take 1 tablet by mouth every 4 (four) hours as needed for severe pain. 12 tablet 0  . predniSONE (DELTASONE) 20 MG tablet 3 tabs po qam x 3 days, 2 tabs po qam x 3 days, 2 tabs po qam x 3 days 18 tablet 0   No current facility-administered medications for this visit.    Medication Side Effects: None  Allergies:  Allergies  Allergen Reactions  . Penicillins Other (See Comments)    Unknown childhood reaction    Past Medical History:  Diagnosis Date  . Viral myocarditis     No family history on file.  Social History   Socioeconomic History  . Marital status: Married    Spouse name: Not on file  . Number of children: Not on file  . Years of education: Not on file  . Highest education level: Not on file  Occupational History  . Not on file  Tobacco Use  . Smoking status: Never Smoker  . Smokeless tobacco: Never Used  Substance and Sexual Activity  . Alcohol use: Yes  . Drug use: No  . Sexual activity: Not on file  Other Topics Concern  . Not on file  Social History Narrative  . Not on file   Social Determinants of Health   Financial Resource Strain:   . Difficulty of Paying Living Expenses:   Food Insecurity:   . Worried About Charity fundraiser in the Last Year:   .  Ran Out of Food in the Last Year:   Transportation Needs:   . Film/video editor (Medical):   Marland Kitchen Lack of Transportation (Non-Medical):   Physical Activity:   . Days of Exercise per Week:   . Minutes of Exercise per Session:   Stress:   . Feeling of Stress :   Social Connections:   . Frequency of Communication with Friends and Family:   . Frequency of Social Gatherings with Friends and Family:   . Attends Religious Services:   . Active Member of Clubs or Organizations:   . Attends Archivist Meetings:   Marland Kitchen Marital Status:   Intimate Partner Violence:   . Fear of  Current or Ex-Partner:   . Emotionally Abused:   Marland Kitchen Physically Abused:   . Sexually Abused:     Past Medical History, Surgical history, Social history, and Family history were reviewed and updated as appropriate.   Please see review of systems for further details on the patient's review from today.   Objective:   Physical Exam:  There were no vitals taken for this visit.  Physical Exam Constitutional:      General: He is not in acute distress. Musculoskeletal:        General: No deformity.  Neurological:     Mental Status: He is alert and oriented to person, place, and time.     Coordination: Coordination normal.  Psychiatric:        Attention and Perception: Attention and perception normal. He does not perceive auditory or visual hallucinations.        Mood and Affect: Mood normal. Mood is not anxious or depressed. Affect is not labile, blunt, angry or inappropriate.        Speech: Speech normal.        Behavior: Behavior normal.        Thought Content: Thought content normal. Thought content is not paranoid or delusional. Thought content does not include homicidal or suicidal ideation. Thought content does not include homicidal or suicidal plan.        Cognition and Memory: Cognition and memory normal.        Judgment: Judgment normal.     Comments: Insight intact     Lab Review:     Component Value Date/Time   NA 140 06/16/2020 1548   K 3.5 06/16/2020 1548   CL 108 06/16/2020 1548   CO2 22 06/16/2020 1548   GLUCOSE 103 (H) 06/16/2020 1548   BUN 16 06/16/2020 1548   CREATININE 0.96 06/16/2020 1548   CALCIUM 9.0 06/16/2020 1548   PROT 7.6 06/16/2020 1548   ALBUMIN 4.6 06/16/2020 1548   AST 25 06/16/2020 1548   ALT 33 06/16/2020 1548   ALKPHOS 40 06/16/2020 1548   BILITOT 0.7 06/16/2020 1548   GFRNONAA >60 06/16/2020 1548   GFRAA >60 06/16/2020 1548       Component Value Date/Time   WBC 11.6 (H) 06/16/2020 1548   RBC 4.68 06/16/2020 1548   HGB 14.1  06/16/2020 1548   HCT 39.3 06/16/2020 1548   PLT 347 06/16/2020 1548   MCV 84.0 06/16/2020 1548   MCH 30.1 06/16/2020 1548   MCHC 35.9 06/16/2020 1548   RDW 12.6 06/16/2020 1548   LYMPHSABS 2.4 11/05/2011 1419   MONOABS 0.3 11/05/2011 1419   EOSABS 0.1 11/05/2011 1419   BASOSABS 0.0 11/05/2011 1419    No results found for: POCLITH, LITHIUM   No results found for: PHENYTOIN, PHENOBARB, VALPROATE, CBMZ   .  res Assessment: Plan:    Plan:  PDMP reviewed  1. Add Adderall 10mg  daily - d/c 20mg  dose  BP 129/85 - 62  [facility] ADHD test 44/58 - ADHD likely  Meets DSM 5 ADHD criteria for ADHD  RTC 4 weeks  Patient advised to contact office with any questions, adverse effects, or acute worsening in signs and symptoms.  Discussed potential benefits, risks, and side effects of stimulants with patient to include increased heart rate, palpitations, insomnia, increased anxiety, increased irritability, or decreased appetite.  Instructed patient to contact office if experiencing any significant tolerability issues.  Greater than 50% of face to face time with patient was spent on counseling and coordination of care. We discussed ADHD, medications, diet, vitamin supplementation, and ADD[employer] website. ADHD testing completed during visit.     Kaleel was seen today for adhd.  Diagnoses and all orders for this visit:  Attention deficit hyperactivity disorder (ADHD), unspecified ADHD type -     amphetamine-dextroamphetamine (ADDERALL) 10 MG tablet; Take 1 tablet (10 mg total) by mouth daily with breakfast.     Please see After Visit Summary for patient specific instructions.  No future appointments.  No orders of the defined types were placed in this encounter.   -------------------------------

## 2020-07-06 MED FILL — AMPHETAMINE SALTS 10 MG: 10 | 30 days supply | Qty: 30 | Fill #0

## 2020-07-19 DIAGNOSIS — Z03818 Encounter for observation for suspected exposure to other biological agents ruled out: Secondary | ICD-10-CM | POA: Diagnosis not present

## 2020-07-20 DIAGNOSIS — Z03818 Encounter for observation for suspected exposure to other biological agents ruled out: Secondary | ICD-10-CM | POA: Diagnosis not present

## 2020-07-26 ENCOUNTER — Ambulatory Visit (INDEPENDENT_AMBULATORY_CARE_PROVIDER_SITE_OTHER): Payer: 59 | Admitting: Adult Health

## 2020-07-26 ENCOUNTER — Other Ambulatory Visit: Payer: Self-pay

## 2020-07-26 ENCOUNTER — Encounter: Payer: Self-pay | Admitting: Adult Health

## 2020-07-26 DIAGNOSIS — F909 Attention-deficit hyperactivity disorder, unspecified type: Secondary | ICD-10-CM | POA: Diagnosis not present

## 2020-07-26 MED ORDER — AMPHETAMINE-DEXTROAMPHETAMINE 15 MG PO TABS: 15.0000 mg | ORAL_TABLET | Freq: Every day | ORAL | 0 refills | Status: DC

## 2020-07-26 MED FILL — AMPHETAMINE-DEXTROAMPHETAMI: 15 | 30 days supply | Qty: 30 | Fill #0

## 2020-07-26 NOTE — Progress Notes (Signed)
STRUMMER CANIPE [id number] 12-06-1975 44 y.o.  Subjective:   Patient ID:  Gabriel Brooks is a 44 y.o. (DOB 05-Feb-1976) male.  Chief Complaint: No chief complaint on file.   HPI Gabriel Brooks presents to the office today for follow-up of ADHD.  Describes mood today as "ok". Pleasant. Mood symptoms - denies depression, anxiety, and irritability. Stating "I'm doing good". Feels like addition of Adderall has been helpful - continues to titrate dose. Recent fishing trip with friends. Stable interest and motivation. Taking medications as prescribed.  Energy levels stable. Active, does not have a regular exercise routine.  Enjoys some usual interests and activities. Married. Lives with wife of 14 years and 2 children 11 and 8. Family local. Spending time with family. Appetite adequate. Weight gain - 163 pounds. Sleeps well most nights. Averages 8 hours. Focus and concentration improved with Adderall - "10mg  too much and 5mg  not quite enough". Completing tasks. Managing aspects of household. 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 Denies SI or HI. Denies AH or VH.  Previous medication trials: Denies  Review of Systems:  Review of Systems  Musculoskeletal: Negative for gait problem.  Neurological: Negative for tremors.  Psychiatric/Behavioral:       Please refer to HPI    Medications: I have reviewed the patient's current medications.  Current Outpatient Medications  Medication Sig Dispense Refill  . amphetamine-dextroamphetamine (ADDERALL) 15 MG tablet Take 1 tablet by mouth daily. 30 tablet 0   No current facility-administered medications for this visit.    Medication Side Effects: None  Allergies:  Allergies  Allergen Reactions  . Penicillins Other (See Comments)    Unknown childhood reaction    Past Medical History:  Diagnosis Date  . Viral myocarditis     No family history on file.  Social History   Socioeconomic History  .  Marital status: Married    Spouse name: Not on file  . Number of children: Not on file  . Years of education: Not on file  . Highest education level: Not on file  Occupational History  . Not on file  Tobacco Use  . Smoking status: Never Smoker  . Smokeless tobacco: Never Used  Substance and Sexual Activity  . Alcohol use: Yes  . Drug use: No  . Sexual activity: Not on file  Other Topics Concern  . Not on file  Social History Narrative  . Not on file   Social Determinants of Health   Financial Resource Strain:   . Difficulty of Paying Living Expenses: Not on file  Food Insecurity:   . Worried About Charity fundraiser in the Last Year: Not on file  . Ran Out of Food in the Last Year: Not on file  Transportation Needs:   . Lack of Transportation (Medical): Not on file  . Lack of Transportation (Non-Medical): Not on file  Physical Activity:   . Days of Exercise per Week: Not on file  . Minutes of Exercise per Session: Not on file  Stress:   . Feeling of Stress : Not on file  Social Connections:   . Frequency of Communication with Friends and Family: Not on file  . Frequency of Social Gatherings with Friends and Family: Not on file  . Attends Religious Services: Not on file  . Active Member of Clubs or Organizations: Not on file  . Attends Archivist Meetings: Not on file  . Marital Status: Not on  file  Intimate Partner Violence:   . Fear of Current or Ex-Partner: Not on file  . Emotionally Abused: Not on file  . Physically Abused: Not on file  . Sexually Abused: Not on file    Past Medical History, Surgical history, Social history, and Family history were reviewed and updated as appropriate.   Please see review of systems for further details on the patient's review from today.   Objective:   Physical Exam:  There were no vitals taken for this visit.  Physical Exam Constitutional:      General: He is not in acute distress. Musculoskeletal:         General: No deformity.  Neurological:     Mental Status: He is alert and oriented to person, place, and time.     Coordination: Coordination normal.  Psychiatric:        Attention and Perception: Attention and perception normal. He does not perceive auditory or visual hallucinations.        Mood and Affect: Mood normal. Mood is not anxious or depressed. Affect is not labile, blunt, angry or inappropriate.        Speech: Speech normal.        Behavior: Behavior normal.        Thought Content: Thought content normal. Thought content is not paranoid or delusional. Thought content does not include homicidal or suicidal ideation. Thought content does not include homicidal or suicidal plan.        Cognition and Memory: Cognition and memory normal.        Judgment: Judgment normal.     Comments: Insight intact     Lab Review:     Component Value Date/Time   NA 140 06/16/2020 1548   K 3.5 06/16/2020 1548   CL 108 06/16/2020 1548   CO2 22 06/16/2020 1548   GLUCOSE 103 (H) 06/16/2020 1548   BUN 16 06/16/2020 1548   CREATININE 0.96 06/16/2020 1548   CALCIUM 9.0 06/16/2020 1548   PROT 7.6 06/16/2020 1548   ALBUMIN 4.6 06/16/2020 1548   AST 25 06/16/2020 1548   ALT 33 06/16/2020 1548   ALKPHOS 40 06/16/2020 1548   BILITOT 0.7 06/16/2020 1548   GFRNONAA >60 06/16/2020 1548   GFRAA >60 06/16/2020 1548       Component Value Date/Time   WBC 11.6 (H) 06/16/2020 1548   RBC 4.68 06/16/2020 1548   HGB 14.1 06/16/2020 1548   HCT 39.3 06/16/2020 1548   PLT 347 06/16/2020 1548   MCV 84.0 06/16/2020 1548   MCH 30.1 06/16/2020 1548   MCHC 35.9 06/16/2020 1548   RDW 12.6 06/16/2020 1548   LYMPHSABS 2.4 11/05/2011 1419   MONOABS 0.3 11/05/2011 1419   EOSABS 0.1 11/05/2011 1419   BASOSABS 0.0 11/05/2011 1419    No results found for: POCLITH, LITHIUM   No results found for: PHENYTOIN, PHENOBARB, VALPROATE, CBMZ   .res Assessment: Plan:    Plan:  PDMP reviewed  1. Add Adderall 15mg   daily - take 1/2 tablet twice daily  BP 122/84 - 78  [facility] ADHD test 44/58 - ADHD likely  Meets DSM 5 ADHD criteria for ADHD  RTC 4 weeks  Patient advised to contact office with any questions, adverse effects, or acute worsening in signs and symptoms.  Discussed potential benefits, risks, and side effects of stimulants with patient to include increased heart rate, palpitations, insomnia, increased anxiety, increased irritability, or decreased appetite.  Instructed patient to contact office if experiencing any  significant tolerability issues.  Greater than 50% of face to face time with patient was spent on counseling and coordination of care. We discussed ADHD, medications, diet, vitamin supplementation, and ADDitude website. ADHD testing completed during visit.   Diagnoses and all orders for this visit:  Attention deficit hyperactivity disorder (ADHD), unspecified ADHD type -     amphetamine-dextroamphetamine (ADDERALL) 15 MG tablet; Take 1 tablet by mouth daily.     Please see After Visit Summary for patient specific instructions.  No future appointments.  No orders of the defined types were placed in this encounter.   -------------------------------

## 2020-08-18 MED FILL — AMPHETAMINE-DEXTROAMPHETAMI: 15 | 30 days supply | Qty: 30 | Fill #0

## 2020-08-23 ENCOUNTER — Encounter: Payer: Self-pay | Admitting: Adult Health

## 2020-08-23 ENCOUNTER — Ambulatory Visit (INDEPENDENT_AMBULATORY_CARE_PROVIDER_SITE_OTHER): Payer: 59 | Admitting: Adult Health

## 2020-08-23 ENCOUNTER — Other Ambulatory Visit: Payer: Self-pay

## 2020-08-23 DIAGNOSIS — F909 Attention-deficit hyperactivity disorder, unspecified type: Secondary | ICD-10-CM | POA: Diagnosis not present

## 2020-08-23 MED ORDER — AMPHETAMINE-DEXTROAMPHETAMINE 15 MG PO TABS: 15.0000 mg | ORAL_TABLET | Freq: Two times a day (BID) | ORAL | 0 refills | Status: DC

## 2020-08-23 NOTE — Progress Notes (Signed)
Gabriel Brooks [id number] [birth year] 44 y.o.  Subjective:   Patient ID:  Gabriel Brooks is a 44 y.o. (DOB 10/09/1976) male.  Chief Complaint: No chief complaint on file.   HPI ZADKIEL DRAGAN presents to the office today for follow-up of ADHD.  Describes mood today as "ok". Pleasant. Mood symptoms - denies depression, anxiety, and irritability. Stating "I'm doing good". Feels like addition of Adderall has been helpful - continues to titrate dose. Recent fishing trip with friends. Stable interest and motivation. Taking medications as prescribed.  Energy levels stable. Active, does not have a regular exercise routine.  Enjoys some usual interests and activities. Married. Lives with wife of 14 years and 2 children 11 and 8. Family local. Spending time with family. Appetite adequate. Weight gain - 163 pounds. Sleeps well most nights. Averages 8 hours. Focus and concentration improved with Adderall - "10mg  too much and 5mg  not quite enough". Completing tasks. Managing aspects of household. 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 Denies SI or HI. Denies AH or VH.  Previous medication trials: Denies  Review of Systems:  Review of Systems  Musculoskeletal: Negative for gait problem.  Neurological: Negative for tremors.  Psychiatric/Behavioral:       Please refer to HPI    Medications: I have reviewed the patient's current medications.  Current Outpatient Medications  Medication Sig Dispense Refill  . amphetamine-dextroamphetamine (ADDERALL) 15 MG tablet Take 1 tablet by mouth 2 (two) times daily. 60 tablet 0   No current facility-administered medications for this visit.    Medication Side Effects: None  Allergies:  Allergies  Allergen Reactions  . Penicillins Other (See Comments)    Unknown childhood reaction    Past Medical History:  Diagnosis Date  . Viral myocarditis     No family history on file.  Social History   Socioeconomic  History  . Marital status: Married    Spouse name: Not on file  . Number of children: Not on file  . Years of education: Not on file  . Highest education level: Not on file  Occupational History  . Not on file  Tobacco Use  . Smoking status: Never Smoker  . Smokeless tobacco: Never Used  Substance and Sexual Activity  . Alcohol use: Yes  . Drug use: No  . Sexual activity: Not on file  Other Topics Concern  . Not on file  Social History Narrative  . Not on file   Social Determinants of Health   Financial Resource Strain:   . Difficulty of Paying Living Expenses: Not on file  Food Insecurity:   . Worried About Charity fundraiser in the Last Year: Not on file  . Ran Out of Food in the Last Year: Not on file  Transportation Needs:   . Lack of Transportation (Medical): Not on file  . Lack of Transportation (Non-Medical): Not on file  Physical Activity:   . Days of Exercise per Week: Not on file  . Minutes of Exercise per Session: Not on file  Stress:   . Feeling of Stress : Not on file  Social Connections:   . Frequency of Communication with Friends and Family: Not on file  . Frequency of Social Gatherings with Friends and Family: Not on file  . Attends Religious Services: Not on file  . Active Member of Clubs or Organizations: Not on file  . Attends Archivist Meetings: Not on file  . Marital  Status: Not on file  Intimate Partner Violence:   . Fear of Current or Ex-Partner: Not on file  . Emotionally Abused: Not on file  . Physically Abused: Not on file  . Sexually Abused: Not on file    Past Medical History, Surgical history, Social history, and Family history were reviewed and updated as appropriate.   Please see review of systems for further details on the patient's review from today.   Objective:   Physical Exam:  There were no vitals taken for this visit.  Physical Exam Constitutional:      General: He is not in acute distress. Musculoskeletal:         General: No deformity.  Neurological:     Mental Status: He is alert and oriented to person, place, and time.     Coordination: Coordination normal.  Psychiatric:        Attention and Perception: Attention and perception normal. He does not perceive auditory or visual hallucinations.        Mood and Affect: Mood normal. Mood is not anxious or depressed. Affect is not labile, blunt, angry or inappropriate.        Speech: Speech normal.        Behavior: Behavior normal.        Thought Content: Thought content normal. Thought content is not paranoid or delusional. Thought content does not include homicidal or suicidal ideation. Thought content does not include homicidal or suicidal plan.        Cognition and Memory: Cognition and memory normal.        Judgment: Judgment normal.     Comments: Insight intact     Lab Review:     Component Value Date/Time   NA 140 06/16/2020 1548   K 3.5 06/16/2020 1548   CL 108 06/16/2020 1548   CO2 22 06/16/2020 1548   GLUCOSE 103 (H) 06/16/2020 1548   BUN 16 06/16/2020 1548   CREATININE 0.96 06/16/2020 1548   CALCIUM 9.0 06/16/2020 1548   PROT 7.6 06/16/2020 1548   ALBUMIN 4.6 06/16/2020 1548   AST 25 06/16/2020 1548   ALT 33 06/16/2020 1548   ALKPHOS 40 06/16/2020 1548   BILITOT 0.7 06/16/2020 1548   GFRNONAA >60 06/16/2020 1548   GFRAA >60 06/16/2020 1548       Component Value Date/Time   WBC 11.6 (H) 06/16/2020 1548   RBC 4.68 06/16/2020 1548   HGB 14.1 06/16/2020 1548   HCT 39.3 06/16/2020 1548   PLT 347 06/16/2020 1548   MCV 84.0 06/16/2020 1548   MCH 30.1 06/16/2020 1548   MCHC 35.9 06/16/2020 1548   RDW 12.6 06/16/2020 1548   LYMPHSABS 2.4 11/05/2011 1419   MONOABS 0.3 11/05/2011 1419   EOSABS 0.1 11/05/2011 1419   BASOSABS 0.0 11/05/2011 1419    No results found for: POCLITH, LITHIUM   No results found for: PHENYTOIN, PHENOBARB, VALPROATE, CBMZ   .res Assessment: Plan:    Plan:  PDMP reviewed  1. Add  Adderall 15mg  daily - take 1/2 tablet twice daily  BP 122/84 - 78  [facility] ADHD test 44/58 - ADHD likelyj  Meets DSM 5 ADHD criteria for ADHD  RTC 5 months  Patient advised to contact office with any questions, adverse effects, or acute worsening in signs and symptoms.  Discussed potential benefits, risks, and side effects of stimulants with patient to include increased heart rate, palpitations, insomnia, increased anxiety, increased irritability, or decreased appetite.  Instructed patient to contact office  if experiencing any significant tolerability issues.  Greater than 50% of face to face time with patient was spent on counseling and coordination of care. We discussed ADHD, medications, diet, vitamin supplementation, and ADDitude website. ADHD testing completed during visit.    Diagnoses and all orders for this visit:  Attention deficit hyperactivity disorder (ADHD), unspecified ADHD type -     amphetamine-dextroamphetamine (ADDERALL) 15 MG tablet; Take 1 tablet by mouth 2 (two) times daily.     Please see After Visit Summary for patient specific instructions.  No future appointments.  No orders of the defined types were placed in this encounter.   -------------------------------

## 2020-09-01 ENCOUNTER — Other Ambulatory Visit (HOSPITAL_COMMUNITY): Payer: Self-pay | Admitting: Internal Medicine

## 2020-09-01 MED FILL — TAMSULOSIN HCL 0.4 MG CAP: 0.4 | 10 days supply | Qty: 10 | Fill #0

## 2020-09-01 MED FILL — ONDANSETRON HCL 8 MG TABLET: 8 | 5 days supply | Qty: 15 | Fill #0

## 2020-09-02 ENCOUNTER — Other Ambulatory Visit: Payer: Self-pay | Admitting: Internal Medicine

## 2020-09-02 ENCOUNTER — Ambulatory Visit
Admission: RE | Admit: 2020-09-02 | Discharge: 2020-09-02 | Disposition: A | Discharge status: Home / Self Care | Payer: 59 | Source: Ambulatory Visit | Attending: Internal Medicine | Admitting: Internal Medicine

## 2020-09-02 ENCOUNTER — Other Ambulatory Visit (HOSPITAL_COMMUNITY): Payer: Self-pay | Admitting: Internal Medicine

## 2020-09-02 DIAGNOSIS — R319 Hematuria, unspecified: Secondary | ICD-10-CM | POA: Diagnosis not present

## 2020-09-02 DIAGNOSIS — R109 Unspecified abdominal pain: Secondary | ICD-10-CM

## 2020-09-02 MED FILL — METRONIDAZOLE 500 MG TABS: 500 | 10 days supply | Qty: 30 | Fill #0

## 2020-09-02 MED FILL — CIPROFLOXACIN HCL 500 MG TA: 500 | 10 days supply | Qty: 20 | Fill #0

## 2020-09-05 ENCOUNTER — Other Ambulatory Visit (HOSPITAL_COMMUNITY): Payer: Self-pay | Admitting: Urology

## 2020-09-05 DIAGNOSIS — N202 Calculus of kidney with calculus of ureter: Secondary | ICD-10-CM | POA: Diagnosis not present

## 2020-09-05 MED FILL — OXYCODONE-APAP 5-325MG: 5-325 | 3 days supply | Qty: 15 | Fill #0

## 2020-10-02 ENCOUNTER — Telehealth: Payer: Self-pay | Admitting: Nurse Practitioner

## 2020-10-02 ENCOUNTER — Other Ambulatory Visit: Payer: Self-pay | Admitting: Nurse Practitioner

## 2020-10-02 ENCOUNTER — Encounter: Payer: Self-pay | Admitting: Nurse Practitioner

## 2020-10-02 DIAGNOSIS — B3322 Viral myocarditis: Secondary | ICD-10-CM

## 2020-10-02 DIAGNOSIS — U071 COVID-19: Secondary | ICD-10-CM

## 2020-10-02 DIAGNOSIS — Z8679 Personal history of other diseases of the circulatory system: Secondary | ICD-10-CM

## 2020-10-02 NOTE — Telephone Encounter (Signed)
I connected by phone with Gabriel Brooks on 10/02/2020 at 2:50 PM to discuss the potential use of a new treatment for mild to moderate COVID-19 viral infection in non-hospitalized patients.  This patient is a 44 y.o. male that meets the FDA criteria for Emergency Use Authorization of COVID monoclonal antibody casirivimab/imdevimab, bamlanivimab/eteseviamb, or sotrovimab.  Has a (+) direct SARS-CoV-2 viral test result  Has mild or moderate COVID-19   Is NOT hospitalized due to COVID-19  Is within 10 days of symptom onset  Has at least one of the high risk factor(s) for progression to severe COVID-19 and/or hospitalization as defined in EUA.  Specific high risk criteria : Cardiovascular disease or hypertension - viral myocarditis hx with scarring  Sx onset 11/11  Sx cough, fu-like symptoms, fatigue, fevers   I have spoken and communicated the following to the patient or parent/caregiver regarding COVID monoclonal antibody treatment:  1. FDA has authorized the emergency use for the treatment of mild to moderate COVID-19 in adults and pediatric patients with positive results of direct SARS-CoV-2 viral testing who are 83 years of age and older weighing at least 40 kg, and who are at high risk for progressing to severe COVID-19 and/or hospitalization.  2. The significant known and potential risks and benefits of COVID monoclonal antibody, and the extent to which such potential risks and benefits are unknown.  3. Information on available alternative treatments and the risks and benefits of those alternatives, including clinical trials.  4. Patients treated with COVID monoclonal antibody should continue to self-isolate and use infection control measures (e.g., wear mask, isolate, social distance, avoid sharing personal items, clean and disinfect high touch surfaces, and frequent handwashing) according to CDC guidelines.   5. The patient or parent/caregiver has the option to accept or refuse  COVID monoclonal antibody treatment.  After reviewing this information with the patient, the patient has agreed to receive one of the available covid 19 monoclonal antibodies and will be provided an appropriate fact sheet prior to infusion.   [name], NP 10/02/2020 2:50 PM

## 2020-10-03 ENCOUNTER — Ambulatory Visit (HOSPITAL_COMMUNITY)
Admission: RE | Admit: 2020-10-03 | Discharge: 2020-10-03 | Disposition: A | Discharge status: Home / Self Care | Payer: 59 | Source: Ambulatory Visit | Attending: Pulmonary Disease | Admitting: Pulmonary Disease

## 2020-10-03 DIAGNOSIS — Z8679 Personal history of other diseases of the circulatory system: Secondary | ICD-10-CM | POA: Diagnosis not present

## 2020-10-03 DIAGNOSIS — U071 COVID-19: Secondary | ICD-10-CM | POA: Diagnosis not present

## 2020-10-03 DIAGNOSIS — B3322 Viral myocarditis: Secondary | ICD-10-CM | POA: Insufficient documentation

## 2020-10-03 DIAGNOSIS — I43 Cardiomyopathy in diseases classified elsewhere: Secondary | ICD-10-CM | POA: Diagnosis not present

## 2020-10-03 MED ORDER — SOTROVIMAB 500 MG/8ML IV SOLN
500.0000 mg | Freq: Once | INTRAVENOUS | Status: AC
  Administered 2020-10-03: 500 mg via INTRAVENOUS

## 2020-10-03 MED ORDER — SODIUM CHLORIDE 0.9 % IV SOLN: INTRAVENOUS | Status: DC | PRN

## 2020-10-03 MED ORDER — ALBUTEROL SULFATE HFA 108 (90 BASE) MCG/ACT IN AERS: 2.0000 | INHALATION_SPRAY | Freq: Once | RESPIRATORY_TRACT | Status: DC | PRN

## 2020-10-03 MED ORDER — EPINEPHRINE 0.3 MG/0.3ML IJ SOAJ: 0.3000 mg | Freq: Once | INTRAMUSCULAR | Status: DC | PRN

## 2020-10-03 MED ORDER — DIPHENHYDRAMINE HCL 50 MG/ML IJ SOLN: 50.0000 mg | Freq: Once | INTRAMUSCULAR | Status: DC | PRN

## 2020-10-03 MED ORDER — METHYLPREDNISOLONE SODIUM SUCC 125 MG IJ SOLR: 125.0000 mg | Freq: Once | INTRAMUSCULAR | Status: DC | PRN

## 2020-10-03 MED ORDER — FAMOTIDINE IN NACL 20-0.9 MG/50ML-% IV SOLN: 20.0000 mg | Freq: Once | INTRAVENOUS | Status: DC | PRN

## 2020-10-03 NOTE — Discharge Instructions (Signed)

## 2020-10-03 NOTE — Progress Notes (Signed)
°  Diagnosis: COVID-19  Physician:Dr WRight  Procedure:sotrovimab  Complications: No immediate complications noted.  Discharge: Discharged home   [place], [place] 10/03/2020

## 2021-01-23 ENCOUNTER — Ambulatory Visit: Payer: 59 | Admitting: Adult Health

## 2021-02-27 ENCOUNTER — Encounter: Payer: Self-pay | Admitting: Adult Health

## 2021-02-27 ENCOUNTER — Ambulatory Visit (INDEPENDENT_AMBULATORY_CARE_PROVIDER_SITE_OTHER): Payer: 59 | Admitting: Adult Health

## 2021-02-27 ENCOUNTER — Other Ambulatory Visit (HOSPITAL_COMMUNITY): Payer: Self-pay

## 2021-02-27 ENCOUNTER — Other Ambulatory Visit: Payer: Self-pay

## 2021-02-27 DIAGNOSIS — F909 Attention-deficit hyperactivity disorder, unspecified type: Secondary | ICD-10-CM | POA: Diagnosis not present

## 2021-02-27 MED ORDER — AMPHETAMINE-DEXTROAMPHETAMINE 5 MG PO TABS
5.0000 mg | ORAL_TABLET | Freq: Every day | ORAL | 0 refills | Status: DC
Start: 2021-02-27 — End: 2021-06-12
  Filled 2021-02-27: qty 90, 90d supply, fill #0

## 2021-02-27 NOTE — Progress Notes (Signed)
JMARI PELC [id number] [birth year] 45 y.o.  Subjective:   Patient ID:  Gabriel Brooks is a 45 y.o. (DOB 1976-03-05) male.  Chief Complaint: No chief complaint on file.   HPI Gabriel Brooks presents to the office today for follow-up of ADHD.  Describes mood today as "ok". Pleasant. Mood symptoms - denies depression, anxiety, and irritability. Stating "I'm doing well". Currently taking Adderall 5mg  daily and feels it works well. Family doing well. Stable interest and motivation. Taking medications as prescribed.  Energy levels stable. Active, does not have a regular exercise routine.  Enjoys some usual interests and activities. Married. Lives with wife of 14 years and 2 children 12 and 8. Family local. Spending time with family. Appetite adequate. Weight gain - 163 pounds. Sleeps well most nights. Averages 8 hours. Focus and concentration stable with Adderall 5mg  daily. Completing tasks. Managing aspects of household. 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 Denies SI or HI.  Denies AH or VH.  Previous medication trials: Denies   Review of Systems:  Review of Systems  Musculoskeletal: Negative for gait problem.  Neurological: Negative for tremors.  Psychiatric/Behavioral:       Please refer to HPI    Medications: I have reviewed the patient's current medications.  Current Outpatient Medications  Medication Sig Dispense Refill  . amphetamine-dextroamphetamine (ADDERALL) 15 MG tablet Take 1 tablet by mouth 2 (two) times daily. 60 tablet 0  . ciprofloxacin (CIPRO) 500 MG tablet TAKE 1 TABLET BY MOUTH EVERY 12 HOURS FOR 10 DAYS 20 tablet 0  . metroNIDAZOLE (FLAGYL) 500 MG tablet TAKE 1 TABLET BY MOUTH THREE TIMES DAILY FOR 10 DAYS 30 tablet 0  . ondansetron (ZOFRAN) 8 MG tablet TAKE 1 TABLET BY MOUTH EVERY 8 HOURS AS NEEDED 15 tablet 0  . oxyCODONE-acetaminophen (PERCOCET/ROXICET) 5-325 MG tablet TAKE 1 TABLET BY MOUTH EVERY 6 HOURS AS NEEDED 15  tablet 0  . tamsulosin (FLOMAX) 0.4 MG CAPS capsule TAKE 1 CAPSULE BY MOUTH ONCE A DAY 10 capsule 0   No current facility-administered medications for this visit.    Medication Side Effects: None  Allergies:  Allergies  Allergen Reactions  . Penicillins Other (See Comments)    Unknown childhood reaction    Past Medical History:  Diagnosis Date  . Viral myocarditis     No family history on file.  Social History   Socioeconomic History  . Marital status: Married    Spouse name: Not on file  . Number of children: Not on file  . Years of education: Not on file  . Highest education level: Not on file  Occupational History  . Not on file  Tobacco Use  . Smoking status: Never Smoker  . Smokeless tobacco: Never Used  Substance and Sexual Activity  . Alcohol use: Yes  . Drug use: No  . Sexual activity: Not on file  Other Topics Concern  . Not on file  Social History Narrative  . Not on file   Social Determinants of Health   Financial Resource Strain: Not on file  Food Insecurity: Not on file  Transportation Needs: Not on file  Physical Activity: Not on file  Stress: Not on file  Social Connections: Not on file  Intimate Partner Violence: Not on file    Past Medical History, Surgical history, Social history, and Family history were reviewed and updated as appropriate.   Please see review of systems for further details on the patient's  review from today.   Objective:   Physical Exam:  There were no vitals taken for this visit.  Physical Exam Constitutional:      General: He is not in acute distress. Musculoskeletal:        General: No deformity.  Neurological:     Mental Status: He is alert and oriented to person, place, and time.     Coordination: Coordination normal.  Psychiatric:        Attention and Perception: Attention and perception normal. He does not perceive auditory or visual hallucinations.        Mood and Affect: Mood normal. Mood is not  anxious or depressed. Affect is not labile, blunt, angry or inappropriate.        Speech: Speech normal.        Behavior: Behavior normal.        Thought Content: Thought content normal. Thought content is not paranoid or delusional. Thought content does not include homicidal or suicidal ideation. Thought content does not include homicidal or suicidal plan.        Cognition and Memory: Cognition and memory normal.        Judgment: Judgment normal.     Comments: Insight intact     Lab Review:     Component Value Date/Time   NA 140 06/16/2020 1548   K 3.5 06/16/2020 1548   CL 108 06/16/2020 1548   CO2 22 06/16/2020 1548   GLUCOSE 103 (H) 06/16/2020 1548   BUN 16 06/16/2020 1548   CREATININE 0.96 06/16/2020 1548   CALCIUM 9.0 06/16/2020 1548   PROT 7.6 06/16/2020 1548   ALBUMIN 4.6 06/16/2020 1548   AST 25 06/16/2020 1548   ALT 33 06/16/2020 1548   ALKPHOS 40 06/16/2020 1548   BILITOT 0.7 06/16/2020 1548   GFRNONAA >60 06/16/2020 1548   GFRAA >60 06/16/2020 1548       Component Value Date/Time   WBC 11.6 (H) 06/16/2020 1548   RBC 4.68 06/16/2020 1548   HGB 14.1 06/16/2020 1548   HCT 39.3 06/16/2020 1548   PLT 347 06/16/2020 1548   MCV 84.0 06/16/2020 1548   MCH 30.1 06/16/2020 1548   MCHC 35.9 06/16/2020 1548   RDW 12.6 06/16/2020 1548   LYMPHSABS 2.4 11/05/2011 1419   MONOABS 0.3 11/05/2011 1419   EOSABS 0.1 11/05/2011 1419   BASOSABS 0.0 11/05/2011 1419    No results found for: POCLITH, LITHIUM   No results found for: PHENYTOIN, PHENOBARB, VALPROATE, CBMZ   .res Assessment: Plan:    Plan:  PDMP reviewed  1. Adderall 5mg  daily   BP 122/84 - 57  [facility] ADHD test 44/58 - ADHD likely  Meets DSM 5 ADHD criteria for ADHD  RTC 6 months  Patient advised to contact office with any questions, adverse effects, or acute worsening in signs and symptoms.  Discussed potential benefits, risks, and side effects of stimulants with patient to include  increased heart rate, palpitations, insomnia, increased anxiety, increased irritability, or decreased appetite.  Instructed patient to contact office if experiencing any significant tolerability issues.  Greater than 50% of face to face time with patient was spent on counseling and coordination of care. We discussed ADHD, medications, diet, vitamin supplementation, and ADDitude website. ADHD testing completed during visit.      Diagnoses and all orders for this visit:  Attention deficit hyperactivity disorder (ADHD), unspecified ADHD type     Please see After Visit Summary for patient specific instructions.  Future Appointments  Date Time Provider [place]  02/27/2021  2:00 PM Bhavin Monjaraz, Berdie Ogren, NP [facility] None    No orders of the defined types were placed in this encounter.   -------------------------------

## 2021-03-13 ENCOUNTER — Other Ambulatory Visit (HOSPITAL_COMMUNITY): Payer: Self-pay

## 2021-03-13 DIAGNOSIS — R310 Gross hematuria: Secondary | ICD-10-CM | POA: Diagnosis not present

## 2021-03-13 DIAGNOSIS — R1032 Left lower quadrant pain: Secondary | ICD-10-CM | POA: Diagnosis not present

## 2021-03-13 MED ORDER — TAMSULOSIN HCL 0.4 MG PO CAPS
ORAL_CAPSULE | ORAL | 0 refills | Status: DC
  Filled 2021-03-13: qty 30, 30d supply, fill #0

## 2021-03-25 ENCOUNTER — Emergency Department (HOSPITAL_COMMUNITY): Payer: 59

## 2021-03-25 ENCOUNTER — Emergency Department (HOSPITAL_COMMUNITY)
Admission: EM | Admit: 2021-03-25 | Discharge: 2021-03-25 | Disposition: A | Discharge status: Home / Self Care | Payer: 59 | Attending: Emergency Medicine | Admitting: Emergency Medicine

## 2021-03-25 ENCOUNTER — Other Ambulatory Visit (HOSPITAL_COMMUNITY): Payer: Self-pay

## 2021-03-25 DIAGNOSIS — R197 Diarrhea, unspecified: Secondary | ICD-10-CM | POA: Insufficient documentation

## 2021-03-25 DIAGNOSIS — N201 Calculus of ureter: Secondary | ICD-10-CM | POA: Insufficient documentation

## 2021-03-25 DIAGNOSIS — R1032 Left lower quadrant pain: Secondary | ICD-10-CM | POA: Diagnosis present

## 2021-03-25 DIAGNOSIS — N132 Hydronephrosis with renal and ureteral calculous obstruction: Secondary | ICD-10-CM | POA: Diagnosis not present

## 2021-03-25 LAB — URINALYSIS, ROUTINE W REFLEX MICROSCOPIC
Bacteria, UA: NONE SEEN
Bilirubin Urine: NEGATIVE
Glucose, UA: NEGATIVE mg/dL
Ketones, ur: NEGATIVE mg/dL
Leukocytes,Ua: NEGATIVE
Nitrite: NEGATIVE
Protein, ur: NEGATIVE mg/dL
Specific Gravity, Urine: 1.014 (ref 1.005–1.030)
pH: 7 (ref 5.0–8.0)

## 2021-03-25 LAB — CBC WITH DIFFERENTIAL/PLATELET
Abs Immature Granulocytes: 0.02 10*3/uL (ref 0.00–0.07)
Basophils Absolute: 0 10*3/uL (ref 0.0–0.1)
Basophils Relative: 1 %
Eosinophils Absolute: 0.1 10*3/uL (ref 0.0–0.5)
Eosinophils Relative: 1 %
HCT: 43.4 % (ref 39.0–52.0)
Hemoglobin: 14.3 g/dL (ref 13.0–17.0)
Immature Granulocytes: 0 %
Lymphocytes Relative: 24 %
Lymphs Abs: 1.6 10*3/uL (ref 0.7–4.0)
MCH: 29.2 pg (ref 26.0–34.0)
MCHC: 32.9 g/dL (ref 30.0–36.0)
MCV: 88.6 fL (ref 80.0–100.0)
Monocytes Absolute: 0.4 10*3/uL (ref 0.1–1.0)
Monocytes Relative: 7 %
Neutro Abs: 4.3 10*3/uL (ref 1.7–7.7)
Neutrophils Relative %: 67 %
Platelets: 311 10*3/uL (ref 150–400)
RBC: 4.9 MIL/uL (ref 4.22–5.81)
RDW: 12.9 % (ref 11.5–15.5)
WBC: 6.4 10*3/uL (ref 4.0–10.5)
nRBC: 0 % (ref 0.0–0.2)

## 2021-03-25 LAB — COMPREHENSIVE METABOLIC PANEL
ALT: 26 U/L (ref 0–44)
AST: 22 U/L (ref 15–41)
Albumin: 4.2 g/dL (ref 3.5–5.0)
Alkaline Phosphatase: 42 U/L (ref 38–126)
Anion gap: 5 (ref 5–15)
BUN: 15 mg/dL (ref 6–20)
CO2: 28 mmol/L (ref 22–32)
Calcium: 9.5 mg/dL (ref 8.9–10.3)
Chloride: 105 mmol/L (ref 98–111)
Creatinine, Ser: 1.24 mg/dL (ref 0.61–1.24)
GFR, Estimated: >60 mL/min (ref >60)
Glucose, Bld: 110 mg/dL — ABNORMAL HIGH (ref 70–99)
Potassium: 3.9 mmol/L (ref 3.5–5.1)
Sodium: 138 mmol/L (ref 135–145)
Total Bilirubin: 0.5 mg/dL (ref 0.3–1.2)
Total Protein: 7.5 g/dL (ref 6.5–8.1)

## 2021-03-25 LAB — LIPASE, BLOOD: Lipase: 44 U/L (ref 11–51)

## 2021-03-25 MED ORDER — KETOROLAC TROMETHAMINE 10 MG PO TABS
10.0000 mg | ORAL_TABLET | Freq: Four times a day (QID) | ORAL | 0 refills | Status: DC | PRN
  Filled 2021-03-25: qty 10, 3d supply, fill #0

## 2021-03-25 MED ORDER — HYDROMORPHONE HCL 1 MG/ML IJ SOLN
0.5000 mg | Freq: Once | INTRAMUSCULAR | Status: AC
  Administered 2021-03-25: 0.5 mg via INTRAVENOUS
  Filled 2021-03-25: qty 1

## 2021-03-25 MED ORDER — SODIUM CHLORIDE 0.9 % IV BOLUS
1000.0000 mL | Freq: Once | INTRAVENOUS | Status: AC
  Administered 2021-03-25: 1000 mL via INTRAVENOUS

## 2021-03-25 MED ORDER — ONDANSETRON HCL 4 MG/2ML IJ SOLN
4.0000 mg | Freq: Once | INTRAMUSCULAR | Status: AC
  Administered 2021-03-25: 4 mg via INTRAVENOUS
  Filled 2021-03-25: qty 2

## 2021-03-25 MED ORDER — KETOROLAC TROMETHAMINE 30 MG/ML IJ SOLN
30.0000 mg | Freq: Once | INTRAMUSCULAR | Status: AC
  Administered 2021-03-25: 30 mg via INTRAVENOUS
  Filled 2021-03-25: qty 1

## 2021-03-25 MED ORDER — KETOROLAC TROMETHAMINE 10 MG PO TABS: 10.0000 mg | ORAL_TABLET | Freq: Four times a day (QID) | ORAL | 0 refills | Status: DC | PRN

## 2021-03-25 NOTE — Discharge Instructions (Signed)
Take Toradol as prescribed for pain not controlled with Tylenol. Do NOT take additional NSAID medications (Aleve, Advil, Ibuprofen) while taking Toradol.  Follow up with your urologist. Strain your urine. Take the stone with you to the appointment. Return to the ER for fevers, pain or vomiting not controlled with medications at home, worsening or concerning symptoms.

## 2021-03-25 NOTE — ED Triage Notes (Signed)
Patient reports having left sided kidney stone that comes and goes since July 2021. Has ct scheduled for this week but pain is unbearable. 10/10. History of kidney stones.

## 2021-03-25 NOTE — ED Provider Notes (Signed)
[facility] [place] DEPT Provider Note   CSN: [id number] Arrival date & time: 03/25/21  0815     History Chief Complaint  Patient presents with  . Flank Pain    Gabriel Brooks is a 45 y.o. male.  45 year old male with complaint of left flank pain, states he feels like he has been trying to pass a kidney stone since July, has pain off and on however pain started at 530 this morning and is not improving with half a oxycodone tablet. Associated with urinary urgency, pain radiates to left testicle, associated with nausea.  Denies vomiting, fevers, chills.  States that he did have loose stools today.  No history of prior stents or lithotripsy.  Patient is followed by [facility] urology who scheduled him for a CT scan next week.        Past Medical History:  Diagnosis Date  . Viral myocarditis     There are no problems to display for this patient.   Past Surgical History:  Procedure Laterality Date  . FEMUR SURGERY         No family history on file.  Social History   Tobacco Use  . Smoking status: Never Smoker  . Smokeless tobacco: Never Used  Substance Use Topics  . Alcohol use: Yes  . Drug use: No    Home Medications Prior to Admission medications   Medication Sig Start Date End Date Taking? Authorizing Provider  ketorolac (TORADOL) 10 MG tablet Take 1 tablet (10 mg total) by mouth every 6 (six) hours as needed. 03/25/21  Yes Tacy Learn, PA-C  amphetamine-dextroamphetamine (ADDERALL) 15 MG tablet Take 1 tablet by mouth 2 (two) times daily. 08/23/20   Mozingo, Berdie Ogren, NP  amphetamine-dextroamphetamine (ADDERALL) 5 MG tablet Take 1 tablet (5 mg total) by mouth daily. 02/27/21   Mozingo, Berdie Ogren, NP  ondansetron (ZOFRAN) 8 MG tablet TAKE 1 TABLET BY MOUTH EVERY 8 HOURS AS NEEDED 09/01/20 09/01/21  Michael Boston, MD  tamsulosin (FLOMAX) 0.4 MG CAPS capsule TAKE 1 CAPSULE BY MOUTH ONCE A DAY 09/01/20 09/01/21  Michael Boston, MD   tamsulosin (FLOMAX) 0.4 MG CAPS capsule Take 1 capsule by mouth daily 03/13/21       Allergies    Penicillins  Review of Systems   Review of Systems  Constitutional: Negative for chills and fever.  Respiratory: Negative for shortness of breath.   Cardiovascular: Negative for chest pain.  Gastrointestinal: Positive for abdominal pain, diarrhea and nausea. Negative for blood in stool, constipation and vomiting.  Genitourinary: Positive for frequency and testicular pain.  Musculoskeletal: Negative for arthralgias and myalgias.  Skin: Negative for rash and wound.  Allergic/Immunologic: Negative for immunocompromised state.  Neurological: Negative for weakness.  Hematological: Negative for adenopathy.  Psychiatric/Behavioral: Negative for confusion.  All other systems reviewed and are negative.   Physical Exam Updated Vital Signs BP 130/88   Pulse 62   Temp 98.2 F (36.8 C) (Oral)   Resp 16   Ht 5\' 7"  (1.702 m)   Wt 72.6 kg   SpO2 98%   BMI 25.06 kg/m   Physical Exam Vitals and nursing note reviewed.  Constitutional:      General: He is not in acute distress.    Appearance: He is well-developed. He is not diaphoretic.  HENT:     Head: Normocephalic and atraumatic.  Cardiovascular:     Rate and Rhythm: Normal rate and regular rhythm.     Pulses: Normal pulses.  Heart sounds: Normal heart sounds.  Pulmonary:     Effort: Pulmonary effort is normal.     Breath sounds: Normal breath sounds.  Abdominal:     Palpations: Abdomen is soft.     Tenderness: There is abdominal tenderness in the left lower quadrant. There is no right CVA tenderness, left CVA tenderness or rebound.  Skin:    General: Skin is warm and dry.     Findings: No erythema or rash.  Neurological:     Mental Status: He is alert and oriented to person, place, and time.  Psychiatric:        Behavior: Behavior normal.     ED Results / Procedures / Treatments   Labs (all labs ordered are listed, but  only abnormal results are displayed) Labs Reviewed  COMPREHENSIVE METABOLIC PANEL - Abnormal; Notable for the following components:      Result Value   Glucose, Bld 110 (*)    All other components within normal limits  URINALYSIS, ROUTINE W REFLEX MICROSCOPIC - Abnormal; Notable for the following components:   Hgb urine dipstick SMALL (*)    All other components within normal limits  CBC WITH DIFFERENTIAL/PLATELET  LIPASE, BLOOD    EKG None  Radiology CT RENAL STONE STUDY  Result Date: 03/25/2021 CLINICAL DATA:  Patient reports having left sided kidney stone that comes and goes since July 2021. Has ct scheduled for this week but pain is unbearable. 10/10. History of kidney stones. EXAM: CT ABDOMEN AND PELVIS WITHOUT CONTRAST TECHNIQUE: Multidetector CT imaging of the abdomen and pelvis was performed following the standard protocol without IV contrast. COMPARISON:  09/02/2020. FINDINGS: Lower chest: Minor stable scarring anterior right middle lobe. Lung bases otherwise clear. Hepatobiliary: No focal liver abnormality is seen. No gallstones, gallbladder wall thickening, or biliary dilatation. Pancreas: Unremarkable. No pancreatic ductal dilatation or surrounding inflammatory changes. Spleen: Normal in size without focal abnormality. Adrenals/Urinary Tract: No adrenal masses. Mild left hydroureteronephrosis. This is due to a 3 mm stone at the left ureterovesicular junction. Kidneys normal in size, orientation and position. No renal masses. 2 mm stone, upper pole of the left kidney. Tiny stone in the lower pole of the right kidney. No right hydronephrosis. Normal right ureter. Bladder is mostly decompressed, otherwise unremarkable. Stomach/Bowel: Stomach is within normal limits. Appendix appears normal. No evidence of bowel wall thickening, distention, or inflammatory changes. Vascular/Lymphatic: No significant vascular findings are present. No enlarged abdominal or pelvic lymph nodes. Reproductive:  Unremarkable. Other: No abdominal wall hernia or abnormality. No abdominopelvic ascites. Musculoskeletal: No fracture or acute finding. No bone lesion. Mild loss of disc height at L4-L5. IMPRESSION: 1. 3 mm stone at the left ureterovesicular junction leads to mild left hydroureteronephrosis. 2. No other acute abnormality within the abdomen or pelvis. 3. Single small nonobstructing stones in each kidney. Electronically Signed   By: Lajean Manes M.D.   On: 03/25/2021 09:06    Procedures Procedures   Medications Ordered in ED Medications  sodium chloride 0.9 % bolus 1,000 mL (0 mLs Intravenous Stopped 03/25/21 1021)  ondansetron (ZOFRAN) injection 4 mg (4 mg Intravenous Given 03/25/21 0853)  HYDROmorphone (DILAUDID) injection 0.5 mg (0.5 mg Intravenous Given 03/25/21 0853)  ketorolac (TORADOL) 30 MG/ML injection 30 mg (30 mg Intravenous Given 03/25/21 5456)    ED Course  I have reviewed the triage vital signs and the nursing notes.  Pertinent labs & imaging results that were available during my care of the patient were reviewed by me and considered  in my medical decision making (see chart for details).  Clinical Course as of 03/25/21 1308  Sat Mar 25, 9288  [id number] 45 year old male with history of kidney stones with complaint of left flank pain intermittent since July, constant since 530 this morning.  On exam, found to have mild left lower quadrant tenderness with complaint of loose stools today.  CT ordered to evaluate for stone versus diverticulitis and shows a 3 mm UPJ stone. Renal function within normal limits, CBC unremarkable, lipase normal limits.  Pain has improved with Toradol.  Plan is to discharge with prescription for same, limited quantity, follow-up with his urologist.  Awaiting urinalysis to evaluate for infection prior to discharge. [LM]    Clinical Course User Index [LM] Roque Lias   MDM Rules/Calculators/A&P                          Final Clinical Impression(s) / ED  Diagnoses Final diagnoses:  Ureterolithiasis    Rx / DC Orders ED Discharge Orders         Ordered    ketorolac (TORADOL) 10 MG tablet  Every 6 hours PRN        03/25/21 0926           Tacy Learn, PA-C 03/25/21 1308    Pattricia Boss, MD 03/25/21 1727

## 2021-03-28 DIAGNOSIS — R1032 Left lower quadrant pain: Secondary | ICD-10-CM | POA: Diagnosis not present

## 2021-03-28 DIAGNOSIS — M5126 Other intervertebral disc displacement, lumbar region: Secondary | ICD-10-CM | POA: Diagnosis not present

## 2021-03-28 DIAGNOSIS — N132 Hydronephrosis with renal and ureteral calculous obstruction: Secondary | ICD-10-CM | POA: Diagnosis not present

## 2021-03-28 DIAGNOSIS — R310 Gross hematuria: Secondary | ICD-10-CM | POA: Diagnosis not present

## 2021-04-04 DIAGNOSIS — N201 Calculus of ureter: Secondary | ICD-10-CM | POA: Diagnosis not present

## 2021-04-04 DIAGNOSIS — N202 Calculus of kidney with calculus of ureter: Secondary | ICD-10-CM | POA: Diagnosis not present

## 2021-04-20 DIAGNOSIS — Z0000 Encounter for general adult medical examination without abnormal findings: Secondary | ICD-10-CM | POA: Diagnosis not present

## 2021-04-25 DIAGNOSIS — Z0000 Encounter for general adult medical examination without abnormal findings: Secondary | ICD-10-CM | POA: Diagnosis not present

## 2021-04-25 DIAGNOSIS — Z1331 Encounter for screening for depression: Secondary | ICD-10-CM | POA: Diagnosis not present

## 2021-04-25 DIAGNOSIS — Z1339 Encounter for screening examination for other mental health and behavioral disorders: Secondary | ICD-10-CM | POA: Diagnosis not present

## 2021-05-26 ENCOUNTER — Other Ambulatory Visit (HOSPITAL_COMMUNITY): Payer: Self-pay

## 2021-05-26 DIAGNOSIS — B360 Pityriasis versicolor: Secondary | ICD-10-CM | POA: Diagnosis not present

## 2021-05-26 DIAGNOSIS — L820 Inflamed seborrheic keratosis: Secondary | ICD-10-CM | POA: Diagnosis not present

## 2021-05-26 DIAGNOSIS — D225 Melanocytic nevi of trunk: Secondary | ICD-10-CM | POA: Diagnosis not present

## 2021-05-26 DIAGNOSIS — L218 Other seborrheic dermatitis: Secondary | ICD-10-CM | POA: Diagnosis not present

## 2021-05-26 DIAGNOSIS — L309 Dermatitis, unspecified: Secondary | ICD-10-CM | POA: Diagnosis not present

## 2021-05-26 DIAGNOSIS — L821 Other seborrheic keratosis: Secondary | ICD-10-CM | POA: Diagnosis not present

## 2021-05-26 DIAGNOSIS — L918 Other hypertrophic disorders of the skin: Secondary | ICD-10-CM | POA: Diagnosis not present

## 2021-05-26 MED ORDER — FLUCONAZOLE 200 MG PO TABS
ORAL_TABLET | ORAL | 0 refills | Status: DC
  Filled 2021-05-26: qty 4, 7d supply, fill #0

## 2021-05-26 MED ORDER — BETAMETHASONE DIPROPIONATE 0.05 % EX CREA
TOPICAL_CREAM | CUTANEOUS | 0 refills | Status: DC
  Filled 2021-05-26: qty 45, 21d supply, fill #0

## 2021-06-12 ENCOUNTER — Other Ambulatory Visit: Payer: Self-pay | Admitting: Adult Health

## 2021-06-12 ENCOUNTER — Other Ambulatory Visit (HOSPITAL_COMMUNITY): Payer: Self-pay

## 2021-06-12 DIAGNOSIS — F909 Attention-deficit hyperactivity disorder, unspecified type: Secondary | ICD-10-CM

## 2021-06-12 MED ORDER — AMPHETAMINE-DEXTROAMPHETAMINE 5 MG PO TABS
5.0000 mg | ORAL_TABLET | Freq: Every day | ORAL | 0 refills | Status: DC
  Filled 2021-06-12: qty 90, 90d supply, fill #0

## 2021-06-12 NOTE — Telephone Encounter (Signed)
Last filled 4/14 appt on 10/11

## 2021-08-29 ENCOUNTER — Other Ambulatory Visit: Payer: Self-pay

## 2021-08-29 ENCOUNTER — Ambulatory Visit (INDEPENDENT_AMBULATORY_CARE_PROVIDER_SITE_OTHER): Payer: 59 | Admitting: Adult Health

## 2021-08-29 ENCOUNTER — Other Ambulatory Visit (HOSPITAL_COMMUNITY): Payer: Self-pay

## 2021-08-29 ENCOUNTER — Encounter: Payer: Self-pay | Admitting: Adult Health

## 2021-08-29 DIAGNOSIS — F909 Attention-deficit hyperactivity disorder, unspecified type: Secondary | ICD-10-CM

## 2021-08-29 MED ORDER — AMPHETAMINE-DEXTROAMPHETAMINE 5 MG PO TABS
5.0000 mg | ORAL_TABLET | Freq: Every day | ORAL | 0 refills | Status: DC
  Filled 2021-08-29 – 2021-09-25 (×2): qty 90, 90d supply, fill #0

## 2021-08-29 MED ORDER — SODIUM FLUORIDE 1.1 % DT GEL
DENTAL | 12 refills | Status: DC
  Filled 2021-08-29 – 2021-09-25 (×2): qty 56, 30d supply, fill #0
  Filled 2022-01-01: qty 56, 30d supply, fill #1

## 2021-08-29 NOTE — Progress Notes (Signed)
PERKINS MOLINA [id number] 08-20-1976 45 y.o.  Subjective:   Patient ID:  Gabriel Brooks is a 45 y.o. (DOB [birth year]) male.  Chief Complaint: No chief complaint on file.   HPI RASHAWD LASKARIS presents to the office today for follow-up of ADHD.  Describes mood today as "ok". Pleasant. Mood symptoms - denies depression, anxiety, and irritability. Stating "I'm doing good". Feels like Adderall 5mg  daily continues to works well. Family doing well. Recent trip to [place]. Stable interest and motivation. Taking medications as prescribed.  Energy levels stable. Active, does not have a regular exercise routine. Playing tennis and walking. Enjoys some usual interests and activities. Married. Lives with wife of 14 years and 2 children 12 and 8. Family local. Spending time with family. Appetite adequate. Weight loss 4 pounds - 159 from 163 pounds. Sleeps well most nights. Averages 8 hours. Focus and concentration stable with Adderall 5mg  daily. Completing tasks. Managing aspects of household. Works full-time - Nature conservation officer - traveling. Denies SI or HI.  Denies AH or VH.  Previous medication trials: Denies  Flowsheet Row ED from 03/25/2021 in [place] DEPT  C-SSRS RISK CATEGORY No Risk       Review of Systems:  Review of Systems  Musculoskeletal:  Negative for gait problem.  Neurological:  Negative for tremors.  Psychiatric/Behavioral:         Please refer to HPI   Medications: I have reviewed the patient's current medications.  Current Outpatient Medications  Medication Sig Dispense Refill   amphetamine-dextroamphetamine (ADDERALL) 15 MG tablet Take 1 tablet by mouth 2 (two) times daily. 60 tablet 0   amphetamine-dextroamphetamine (ADDERALL) 5 MG tablet Take 1 tablet (5 mg total) by mouth daily. 90 tablet 0   betamethasone dipropionate 0.05 % cream Apply a thin layer on affected areas 2 times a day for 3 weeks then repeat as needed 45 g 0    fluconazole (DIFLUCAN) 200 MG tablet Take 2 tablets bymouth now and repeat in 1 week. 4 tablet 0   ketorolac (TORADOL) 10 MG tablet Take 1 tablet (10 mg total) by mouth every 6 (six) hours as needed. 10 tablet 0   ondansetron (ZOFRAN) 8 MG tablet TAKE 1 TABLET BY MOUTH EVERY 8 HOURS AS NEEDED 15 tablet 0   sodium fluoride (PREVIDENT 5000 DRY MOUTH) 1.1 % GEL dental gel Brush on teeth 2 times a day for 2 mins each time, expectorate as much as possible then DO NOT swish, eat, or drink for 67min 56 g 12   tamsulosin (FLOMAX) 0.4 MG CAPS capsule TAKE 1 CAPSULE BY MOUTH ONCE A DAY 10 capsule 0   tamsulosin (FLOMAX) 0.4 MG CAPS capsule Take 1 capsule by mouth daily 30 capsule 0   No current facility-administered medications for this visit.    Medication Side Effects: None  Allergies:  Allergies  Allergen Reactions   Penicillins Other (See Comments)    Unknown childhood reaction    Past Medical History:  Diagnosis Date   Viral myocarditis     Past Medical History, Surgical history, Social history, and Family history were reviewed and updated as appropriate.   Please see review of systems for further details on the patient's review from today.   Objective:   Physical Exam:  There were no vitals taken for this visit.  Physical Exam Constitutional:      General: He is not in acute distress. Musculoskeletal:        General: No  deformity.  Neurological:     Mental Status: He is alert and oriented to person, place, and time.     Coordination: Coordination normal.  Psychiatric:        Attention and Perception: Attention and perception normal. He does not perceive auditory or visual hallucinations.        Mood and Affect: Mood normal. Mood is not anxious or depressed. Affect is not labile, blunt, angry or inappropriate.        Speech: Speech normal.        Behavior: Behavior normal.        Thought Content: Thought content normal. Thought content is not paranoid or delusional. Thought  content does not include homicidal or suicidal ideation. Thought content does not include homicidal or suicidal plan.        Cognition and Memory: Cognition and memory normal.        Judgment: Judgment normal.     Comments: Insight intact    Lab Review:     Component Value Date/Time   NA 138 03/25/2021 0855   K 3.9 03/25/2021 0855   CL 105 03/25/2021 0855   CO2 28 03/25/2021 0855   GLUCOSE 110 (H) 03/25/2021 0855   BUN 15 03/25/2021 0855   CREATININE 1.24 03/25/2021 0855   CALCIUM 9.5 03/25/2021 0855   PROT 7.5 03/25/2021 0855   ALBUMIN 4.2 03/25/2021 0855   AST 22 03/25/2021 0855   ALT 26 03/25/2021 0855   ALKPHOS 42 03/25/2021 0855   BILITOT 0.5 03/25/2021 0855   GFRNONAA >60 03/25/2021 0855   GFRAA >60 06/16/2020 1548       Component Value Date/Time   WBC 6.4 03/25/2021 0855   RBC 4.90 03/25/2021 0855   HGB 14.3 03/25/2021 0855   HCT 43.4 03/25/2021 0855   PLT 311 03/25/2021 0855   MCV 88.6 03/25/2021 0855   MCH 29.2 03/25/2021 0855   MCHC 32.9 03/25/2021 0855   RDW 12.9 03/25/2021 0855   LYMPHSABS 1.6 03/25/2021 0855   MONOABS 0.4 03/25/2021 0855   EOSABS 0.1 03/25/2021 0855   BASOSABS 0.0 03/25/2021 0855    No results found for: POCLITH, LITHIUM   No results found for: PHENYTOIN, PHENOBARB, VALPROATE, CBMZ   .res Assessment: Plan:    Plan:  PDMP reviewed  1. Adderall 5mg  daily   BP WNL- 115/81 67  RTC 6 months  Patient advised to contact office with any questions, adverse effects, or acute worsening in signs and symptoms.  Discussed potential benefits, risks, and side effects of stimulants with patient to include increased heart rate, palpitations, insomnia, increased anxiety, increased irritability, or decreased appetite.  Instructed patient to contact office if experiencing any significant tolerability issues.  Diagnoses and all orders for this visit:  Attention deficit hyperactivity disorder (ADHD), unspecified ADHD type -      amphetamine-dextroamphetamine (ADDERALL) 5 MG tablet; Take 1 tablet (5 mg total) by mouth daily.    Please see After Visit Summary for patient specific instructions.  No future appointments.  No orders of the defined types were placed in this encounter.   -------------------------------

## 2021-09-07 ENCOUNTER — Other Ambulatory Visit (HOSPITAL_COMMUNITY): Payer: Self-pay

## 2021-09-25 ENCOUNTER — Other Ambulatory Visit (HOSPITAL_COMMUNITY): Payer: Self-pay

## 2021-10-20 ENCOUNTER — Other Ambulatory Visit (HOSPITAL_COMMUNITY): Payer: Self-pay

## 2021-10-20 MED ORDER — CARESTART COVID-19 HOME TEST VI KIT
PACK | 0 refills | Status: DC
  Filled 2021-10-20 – 2022-01-01 (×2): qty 4, 4d supply, fill #0

## 2021-10-30 ENCOUNTER — Other Ambulatory Visit (HOSPITAL_COMMUNITY): Payer: Self-pay

## 2021-12-06 ENCOUNTER — Ambulatory Visit: Payer: 59 | Admitting: Surgery

## 2021-12-06 ENCOUNTER — Encounter: Payer: Self-pay | Admitting: Surgery

## 2021-12-06 ENCOUNTER — Ambulatory Visit: Payer: Self-pay

## 2021-12-06 ENCOUNTER — Other Ambulatory Visit: Payer: Self-pay

## 2021-12-06 VITALS — BP 124/87 | HR 61 | Ht 67.0 in | Wt 160.0 lb

## 2021-12-06 DIAGNOSIS — M25559 Pain in unspecified hip: Secondary | ICD-10-CM

## 2021-12-06 DIAGNOSIS — M5442 Lumbago with sciatica, left side: Secondary | ICD-10-CM

## 2021-12-06 DIAGNOSIS — M7062 Trochanteric bursitis, left hip: Secondary | ICD-10-CM

## 2021-12-06 MED ORDER — METHYLPREDNISOLONE 4 MG PO TABS: ORAL_TABLET | ORAL | 0 refills | Status: DC

## 2021-12-06 NOTE — Progress Notes (Unsigned)
° °  Office Visit Note   Patient: Gabriel Brooks           Date of Birth: Aug 12, 1976           MRN: [id number] Visit Date: 12/06/2021              Requested by: Michael Boston, MD [id number] [place],  [place] [id number] PCP: Michael Boston, MD   Assessment & Plan: Visit Diagnoses:  1. Acute left-sided low back pain with left-sided sciatica   2. Hip pain   3. Greater trochanteric bursitis of left hip     Plan: ***  Follow-Up Instructions: Return in about 4 weeks (around 01/03/2022) for with dr Louanne Skye for recheck left hip and lumbar.   Orders:  Orders Placed This Encounter  Procedures   XR HIP UNILAT W OR W/O PELVIS 2-3 VIEWS LEFT   XR Lumbar Spine 2-3 Views   Ambulatory referral to Physical Therapy   Meds ordered this encounter  Medications   methylPREDNISolone (MEDROL) 4 MG tablet    Sig: 6 day taper to be taken as directed.    Dispense:  21 tablet    Refill:  0      Procedures: No procedures performed   Clinical Data: No additional findings.   Subjective: Chief Complaint  Patient presents with   Lower Back - Pain    HPI  Review of Systems   Objective: Vital Signs: BP 124/87    Pulse 61    Ht 5\' 7"  (1.702 m)    Wt 160 lb (72.6 kg)    BMI 25.06 kg/m   Physical Exam  Ortho Exam  Specialty Comments:  No specialty comments available.  Imaging: No results found.   PMFS History: There are no problems to display for this patient.  Past Medical History:  Diagnosis Date   Viral myocarditis     History reviewed. No pertinent family history.  Past Surgical History:  Procedure Laterality Date   FEMUR SURGERY     Social History   Occupational History   Not on file  Tobacco Use   Smoking status: Never   Smokeless tobacco: Never  Substance and Sexual Activity   Alcohol use: Yes   Drug use: No   Sexual activity: Not on file

## 2021-12-11 ENCOUNTER — Other Ambulatory Visit (HOSPITAL_COMMUNITY): Payer: Self-pay

## 2022-01-01 ENCOUNTER — Other Ambulatory Visit (HOSPITAL_COMMUNITY): Payer: Self-pay

## 2022-01-01 ENCOUNTER — Other Ambulatory Visit: Payer: Self-pay | Admitting: Adult Health

## 2022-01-01 DIAGNOSIS — F909 Attention-deficit hyperactivity disorder, unspecified type: Secondary | ICD-10-CM

## 2022-01-01 MED ORDER — AMPHETAMINE-DEXTROAMPHETAMINE 5 MG PO TABS
5.0000 mg | ORAL_TABLET | Freq: Every day | ORAL | 0 refills | Status: DC
  Filled 2022-01-01: qty 90, 90d supply, fill #0

## 2022-01-01 NOTE — Telephone Encounter (Signed)
Last filled 11/7 appt on 4/11

## 2022-01-03 ENCOUNTER — Other Ambulatory Visit (HOSPITAL_COMMUNITY): Payer: Self-pay

## 2022-01-09 ENCOUNTER — Ambulatory Visit: Payer: 59 | Admitting: Physical Therapy

## 2022-01-09 NOTE — Therapy (Unsigned)
OUTPATIENT PHYSICAL THERAPY THORACOLUMBAR EVALUATION   Patient Name: Gabriel Brooks MRN: [id number] DOB:12/04/1975, 46 y.o., male Today's Date: 01/09/2022    Past Medical History:  Diagnosis Date   Viral myocarditis    Past Surgical History:  Procedure Laterality Date   FEMUR SURGERY     There are no problems to display for this patient.   PCP: Michael Boston, MD  REFERRING PROVIDER: Lanae Crumbly, PA-C  REFERRING DIAG: [phone] (ICD-10-CM) - Acute left-sided low back pain with left-sided sciatica   THERAPY DIAG:  No diagnosis found.  ONSET DATE: ***  SUBJECTIVE:                                                                                                                                                                                           SUBJECTIVE STATEMENT: *** PERTINENT HISTORY:  Eval and treat as needed.  Left hip greater trochanteric bursitis, and lumbar radiculopathy. S/p ORIF left femur with IM nail 2007  PMH: viral myocarditis  PAIN:  Are you having pain? {yes/no:20286} NPRS scale: ***/10 Pain location: *** Pain orientation: {Pain Orientation:25161}  PAIN TYPE: {type:313116} Pain description: {PAIN DESCRIPTION:21022940}  Aggravating factors: *** Relieving factors: ***  PRECAUTIONS: None  WEIGHT BEARING RESTRICTIONS No  FALLS:  Has patient fallen in last 6 months? No, Number of falls: 0  LIVING ENVIRONMENT: Lives with: {[facility] lives with:25569::"lives with their family"} Lives in: {Lives in:25570} Stairs: {yes/no:20286}; {Stairs:24000} Has following equipment at home: {Assistive devices:23999}  OCCUPATION: ***  PLOF: {PLOF:24004}  PATIENT GOALS ***   OBJECTIVE:   DIAGNOSTIC FINDINGS:  ***  PATIENT SURVEYS:  FOTO ***  SCREENING FOR RED FLAGS: Bowel or bladder incontinence: {Yes/No:304960894}   COGNITION:  Overall cognitive status: Within functional limits for tasks assessed     SENSATION:  Light touch:  {intact/deficits:24005}  Stereognosis: {intact/deficits:24005}  Hot/Cold: {intact/deficits:24005}  Proprioception: {intact/deficits:24005}  MUSCLE LENGTH: Hamstrings: Right *** deg; Left *** deg Thomas test: Right *** deg; Left *** deg  POSTURE:  ***  PALPATION: ***  LUMBARAROM/PROM  A/PROM A/PROM  01/09/2022  Flexion   Extension   Right lateral flexion   Left lateral flexion   Right rotation   Left rotation    (Blank rows = not tested)  LE AROM/PROM:  A/PROM Right 01/09/2022 Left 01/09/2022  Hip flexion    Hip extension    Hip abduction    Hip adduction    Hip internal rotation    Hip external rotation    Knee flexion    Knee extension    Ankle dorsiflexion    Ankle plantarflexion    Ankle inversion    Ankle  eversion     (Blank rows = not tested)  LE MMT:  MMT Right 01/09/2022 Left 01/09/2022  Hip flexion    Hip extension    Hip abduction    Hip adduction    Hip internal rotation    Hip external rotation    Knee flexion    Knee extension    Ankle dorsiflexion    Ankle plantarflexion    Ankle inversion    Ankle eversion     (Blank rows = not tested)  LUMBAR SPECIAL TESTS:  {lumbar special test:25242}  FUNCTIONAL TESTS:  {Functional tests:24029}  GAIT: Distance walked: *** Assistive device utilized: {Assistive devices:23999} Level of assistance: {Levels of assistance:24026} Comments: ***    TODAY'S TREATMENT  ***   PATIENT EDUCATION:  Education details: *** Person educated: {Person educated:25204} Education method: {Education Method:25205} Education comprehension: {Education Comprehension:25206}   HOME EXERCISE PROGRAM: ***  ASSESSMENT:  CLINICAL IMPRESSION: Patient is a *** y.o. *** who was seen today for physical therapy evaluation and treatment for ***.    OBJECTIVE IMPAIRMENTS {opptimpairments:25111}.   ACTIVITY LIMITATIONS {activity limitations:25113}.   PERSONAL FACTORS {Personal factors:25162} are also affecting  patient's functional outcome.    REHAB POTENTIAL: {rehabpotential:25112}  CLINICAL DECISION MAKING: {clinical decision making:25114}  EVALUATION COMPLEXITY: {Evaluation complexity:25115}   GOALS: Goals reviewed with patient? {yes/no:20286}  SHORT TERM GOALS:  STG Name Target Date Goal status  1 Independent with initial HEP Baseline:  {follow up:25551} {GOALSTATUS:25110}  2 *** Baseline:  {follow up:25551} {GOALSTATUS:25110}        LONG TERM GOALS:   LTG Name Target Date Goal status  1 Independent with final HEP Baseline: {follow up:25551} {GOALSTATUS:25110}  2 FOTO score improved to *** for improved function Baseline: {follow up:25551} {GOALSTATUS:25110}  3 *** improved to *** for improved function Baseline: {follow up:25551} {GOALSTATUS:25110}  4 Report pain < ***/10 with ***  Baseline: {follow up:25551} {GOALSTATUS:25110}                    PLAN: PT FREQUENCY: {rehab frequency:25116}  PT DURATION: {rehab duration:25117}  PLANNED INTERVENTIONS: Therapeutic exercises, Therapeutic activity, Neuro Muscular re-education, Balance training, Gait training, Patient/Family education, Joint mobilization, Stair training, DME instructions, Dry Needling, Electrical stimulation, Cryotherapy, Moist heat, Taping, Ultrasound, Ionotophoresis 4mg /ml Dexamethasone, and Manual therapy. All included unless contraindicated  PLAN FOR NEXT SESSION: ***   Oretha Caprice, PT, MPT 01/09/2022, 7:54 AM

## 2022-01-11 ENCOUNTER — Ambulatory Visit (INDEPENDENT_AMBULATORY_CARE_PROVIDER_SITE_OTHER): Payer: 59 | Admitting: Physical Therapy

## 2022-01-11 ENCOUNTER — Other Ambulatory Visit: Payer: Self-pay

## 2022-01-11 ENCOUNTER — Encounter: Payer: Self-pay | Admitting: Physical Therapy

## 2022-01-11 DIAGNOSIS — G8929 Other chronic pain: Secondary | ICD-10-CM | POA: Diagnosis not present

## 2022-01-11 DIAGNOSIS — M5442 Lumbago with sciatica, left side: Secondary | ICD-10-CM | POA: Diagnosis not present

## 2022-01-11 DIAGNOSIS — M6281 Muscle weakness (generalized): Secondary | ICD-10-CM

## 2022-01-11 DIAGNOSIS — R29898 Other symptoms and signs involving the musculoskeletal system: Secondary | ICD-10-CM | POA: Diagnosis not present

## 2022-01-11 NOTE — Therapy (Signed)
OUTPATIENT PHYSICAL THERAPY THORACOLUMBAR EVALUATION   Patient Name: Gabriel Brooks MRN: [id number] DOB:[birth year], 46 y.o., male Today's Date: 01/11/2022   PT End of Session - 01/11/22 0842     Visit Number 1    Number of Visits 6    Date for PT Re-Evaluation 02/22/22    Authorization Type UMR    PT Start Time 0801    PT Stop Time 0840    PT Time Calculation (min) 39 min    Activity Tolerance Patient tolerated treatment well    Behavior During Therapy [facility] for tasks assessed/performed             Past Medical History:  Diagnosis Date   Viral myocarditis    Past Surgical History:  Procedure Laterality Date   FEMUR SURGERY     There are no problems to display for this patient.   PCP: Michael Boston, MD  REFERRING PROVIDER: Lanae Crumbly, PA-C  REFERRING DIAG: [phone] (ICD-10-CM) - Acute left-sided low back pain with left-sided sciatica Eval and treat as needed. Left hip greater trochanteric bursitis, and lumbar radiculopathy. S/p ORIF left femur with IM nail 2007   THERAPY DIAG:  Chronic bilateral low back pain with left-sided sciatica - Plan: PT plan of care cert/re-cert  Muscle weakness (generalized) - Plan: PT plan of care cert/re-cert  Other symptoms and signs involving the musculoskeletal system - Plan: PT plan of care cert/re-cert  ONSET DATE: chronic x at least 1 year  SUBJECTIVE:                                                                                                                                                                                           SUBJECTIVE STATEMENT: Pt is a 46 y/o male who presents to OPPT for chronic Lt sided LBP.  He has a hx of Lt femur fx in 2007.  He reports Lt sided radicular pain over the past year, worse over the past 6 months.  He does have some Rt sided LBP as well that does not radiate.  PERTINENT HISTORY:  ADHD  PAIN:  Are you having pain? Yes NPRS scale: 3/10,up to 5/10, at best 1/10 Pain location:  back Pain orientation: Right, Left, and Other: LLE to knee   PAIN TYPE: dull and persistent, chronic Pain description: intermittent  Aggravating factors: standing, difficulty sleeping Relieving factors: motion and lifting helps  PRECAUTIONS: None  WEIGHT BEARING RESTRICTIONS No  FALLS:  Has patient fallen in last 6 months? 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#  PLOF: Independent  PATIENT GOALS improve pain especially with standing, sleep better   OBJECTIVE:   DIAGNOSTIC FINDINGS:  CT: "bulging disc" L4-23 Mar 2021  PATIENT SURVEYS:  01/11/22 FOTO 72 (predicted 61)  SCREENING FOR RED FLAGS: Bowel or bladder incontinence: No  COGNITION:  Overall cognitive status: Within functional limits for tasks assessed     SENSATION:  Light touch: Appears intact    MUSCLE LENGTH: 01/11/22 Hamstrings: tightness noted bil  POSTURE:  No significant deviations noted  PALPATION: 01/11/22: mild trigger points noted in bil QL  LUMBARAROM/PROM  A/PROM A/PROM  01/11/2022  Flexion Limited 50%  Extension WNL  Right lateral flexion WNL  Left lateral flexion WNL (Rt side pain)  Right rotation WNL (Rt side pain)  Left rotation WNL   (Blank rows = not tested)  LE MMT:  MMT Right 01/11/2022 Left 01/11/2022  Hip flexion 5/5 5/5  Hip extension    Hip abduction  4/5  Hip adduction    Hip internal rotation    Hip external rotation    Knee flexion 5/5 4/5  Knee extension 5/5 5/5  Ankle dorsiflexion 5/5 4/5  Ankle plantarflexion    Ankle inversion    Ankle eversion     (Blank rows = not tested)  LUMBAR SPECIAL TESTS:  01/11/22: Slump test: Positive on Lt  GAIT: WNL  TODAY'S TREATMENT  01/11/22: See HEP; pt performed trial reps PRN for comprehension; reported  decreased symptoms after session   PATIENT EDUCATION:  Education details: HEP  Person educated: Patient Education method: Explanation, Demonstration, and Handouts Education comprehension: verbalized understanding, returned demonstration, and needs further education   HOME EXERCISE PROGRAM: Access Code: [email or web address] URL: [email or web address] Date: 01/11/2022 Prepared by: Faustino Congress  Exercises Left Standing Lateral Shift Correction at Wall - Repetitions - 3-6 x daily - 7 x weekly - 1 sets - 10 reps - 10 sec hold Prone on Elbows Stretch - 3-6 x daily - 7 x weekly - 1 sets - 1 reps - 3 min hold Standing Lumbar Extension at Wall - Forearms - 3-6 x daily - 7 x weekly - 1 sets - 10 reps - 10 sec hold Seated Hamstring Stretch - 2 x daily - 7 x weekly - 1 sets - 3 reps - 30 sec hold Supine Piriformis Stretch with Foot on Ground - 2 x daily - 7 x weekly - 1 sets - 3 reps - 30 sec hold   ASSESSMENT:  CLINICAL IMPRESSION: Patient is a 47 y.o. male who was seen today for physical therapy evaluation and treatment for Lt hip and LBP. He responded well to extension based exercises so will continue with this until symptoms centralize.    OBJECTIVE IMPAIRMENTS decreased ROM, decreased strength, increased fascial restrictions, increased muscle spasms, and pain.   ACTIVITY LIMITATIONS community activity, driving, occupation, and sleeping .   PERSONAL FACTORS 1 comorbidity: ADHD  are also affecting patient's functional outcome.    REHAB POTENTIAL: Excellent  CLINICAL DECISION MAKING: Evolving/moderate complexity  EVALUATION COMPLEXITY: Moderate   GOALS: Goals reviewed with patient? Yes  SHORT TERM GOALS:  STG Name Target Date Goal status  1 Independent with initial HEP Baseline:  02/01/2022 INITIAL  2 Report centralization of symptoms for improved function Baseline:  02/01/2022 INITIAL   LONG TERM GOALS:   LTG Name Target Date Goal status  1 Independent with  final HEP Baseline: 02/22/2022  INITIAL  2 FOTO score improved to 67 for improved function Baseline: 02/22/2022 INITIAL  3 Lumbar flexion improved to 25% limitation for improved function Baseline: 02/22/2022 INITIAL  4 Report pain < 2/10 with sleeping and driving  Baseline: 06/19/8402 INITIAL    PLAN: PT FREQUENCY: 1x/week  PT DURATION: 6 weeks  PLANNED INTERVENTIONS: Therapeutic exercises, Therapeutic activity, Neuro Muscular re-education, Patient/Family education, Joint mobilization, Dry Needling, Electrical stimulation, Spinal mobilization, Cryotherapy, Moist heat, Taping, Traction, and Manual therapy  PLAN FOR NEXT SESSION: review HEP, cont extension based exercises, core/hip strengthening PRN, consider possible DN     Laureen Abrahams, PT, DPT 01/11/22 8:44 AM

## 2022-01-15 ENCOUNTER — Encounter: Payer: Self-pay | Admitting: Physical Therapy

## 2022-01-15 ENCOUNTER — Ambulatory Visit: Payer: 59 | Admitting: Physical Therapy

## 2022-01-15 ENCOUNTER — Other Ambulatory Visit: Payer: Self-pay

## 2022-01-15 DIAGNOSIS — R29898 Other symptoms and signs involving the musculoskeletal system: Secondary | ICD-10-CM | POA: Diagnosis not present

## 2022-01-15 DIAGNOSIS — G8929 Other chronic pain: Secondary | ICD-10-CM | POA: Diagnosis not present

## 2022-01-15 DIAGNOSIS — M6281 Muscle weakness (generalized): Secondary | ICD-10-CM

## 2022-01-15 DIAGNOSIS — M5442 Lumbago with sciatica, left side: Secondary | ICD-10-CM

## 2022-01-15 NOTE — Therapy (Signed)
OUTPATIENT PHYSICAL THERAPY TREATMENT NOTE   Patient Name: TEQUAN REDMON MRN: [id number] DOB:[birth year], 46 y.o., male Today's Date: 01/15/2022  PCP: Michael Boston, MD REFERRING PROVIDER: Michael Boston, MD   PT End of Session - 01/15/22 1010     Visit Number 2    Number of Visits 6    Date for PT Re-Evaluation 02/22/22    Authorization Type UMR    PT Start Time 1014    PT Stop Time 1052    PT Time Calculation (min) 38 min    Activity Tolerance Patient tolerated treatment well    Behavior During Therapy WFL for tasks assessed/performed             Past Medical History:  Diagnosis Date   Viral myocarditis    Past Surgical History:  Procedure Laterality Date   FEMUR SURGERY     There are no problems to display for this patient.   REFERRING DIAG: M54.42 (ICD-10-CM) - Acute left-sided low back pain with left-sided sciatica Eval and treat as needed. Left hip greater trochanteric bursitis, and lumbar radiculopathy. S/p ORIF left femur with IM nail 2007   THERAPY DIAG:  Chronic bilateral low back pain with left-sided sciatica  Muscle weakness (generalized)  Other symptoms and signs involving the musculoskeletal system  PERTINENT HISTORY: ADHD  PRECAUTIONS: None  SUBJECTIVE: reports feeling "good" back was more painful this weekend but has settled this weekend.  No symptoms to foot over the weekend  PAIN:  Are you having pain? Yes NPRS scale: 0/10 Pain location: back Pain orientation: Left and Lower  PAIN TYPE: radiating to distal thigh, mainly in glute Pain description: burning  Aggravating factors: transitioning from sit to stand, sitting, standing Relieving factors: repositioning    OBJECTIVE:    DIAGNOSTIC FINDINGS:  CT: "bulging disc" L4-23 Mar 2021   PATIENT SURVEYS:  01/11/22 FOTO 56 (predicted 67)        MUSCLE LENGTH: 01/11/22 Hamstrings: tightness noted bil   PALPATION: 01/11/22: mild trigger points noted in bil QL   LUMBARAROM/PROM    A/PROM A/PROM  01/11/2022  Flexion Limited 50%  Extension WNL  Right lateral flexion WNL  Left lateral flexion WNL (Rt side pain)  Right rotation WNL (Rt side pain)  Left rotation WNL   (Blank rows = not tested)   LE MMT:   MMT Right 01/11/2022 Left 01/11/2022  Hip flexion 5/5 5/5  Hip extension      Hip abduction   4/5  Hip adduction      Hip internal rotation      Hip external rotation      Knee flexion 5/5 4/5  Knee extension 5/5 5/5  Ankle dorsiflexion 5/5 4/5  Ankle plantarflexion      Ankle inversion      Ankle eversion       (Blank rows = not tested)   LUMBAR SPECIAL TESTS:  01/11/22: Slump test: Positive on Lt   GAIT: WNL   TODAY'S TREATMENT  01/15/22 Therex:  Lt lateral shift correction 10 x 10 sec hold  Standing lumbar extension with forearms on wall 10 x 10 sec hold  Prone on elbows x 3 min   Manual: Use of tennis ball for self mobilization x5 min; mild tightness noted with pain with L3-5 CPA mobs Discussed DN as well as traction as options to try if still having pain next week.      01/11/22: See HEP; pt performed trial reps PRN for comprehension; reported decreased  symptoms after session     PATIENT EDUCATION:  Education details: HEP  Person educated: Patient Education method: Explanation, Demonstration, and Handouts Education comprehension: verbalized understanding, returned demonstration, and needs further education     HOME EXERCISE PROGRAM: Access Code: [email or web address] URL: [email or web address] Date: 01/11/2022 Prepared by: Faustino Congress   Exercises Left Standing Lateral Shift Correction at Wall - Repetitions - 3-6 x daily - 7 x weekly - 1 sets - 10 reps - 10 sec hold Prone on Elbows Stretch - 3-6 x daily - 7 x weekly - 1 sets - 1 reps - 3 min hold Standing Lumbar Extension at Wall - Forearms - 3-6 x daily - 7 x weekly - 1 sets - 10 reps - 10 sec hold Seated Hamstring Stretch - 2 x daily - 7 x weekly - 1 sets - 3 reps - 30  sec hold Supine Piriformis Stretch with Foot on Ground - 2 x daily - 7 x weekly - 1 sets - 3 reps - 30 sec hold     ASSESSMENT:   CLINICAL IMPRESSION: Pt with slight centralization of symptoms noted today.  Encouraged increased frequency of HEP at home and added STM with tennis ball to Rt paraspinals.  Will continue to benefit from PT to maximize function.  STG #1 met.       OBJECTIVE IMPAIRMENTS decreased ROM, decreased strength, increased fascial restrictions, increased muscle spasms, and pain.    ACTIVITY LIMITATIONS community activity, driving, occupation, and sleeping .    PERSONAL FACTORS 1 comorbidity: ADHD  are also affecting patient's functional outcome.      REHAB POTENTIAL: Excellent   CLINICAL DECISION MAKING: Evolving/moderate complexity   EVALUATION COMPLEXITY: Moderate     GOALS: Goals reviewed with patient? Yes   SHORT TERM GOALS:   STG Name Target Date Goal status  1 Independent with initial HEP Baseline:  02/01/2022 MET  2 Report centralization of symptoms for improved function Baseline:  02/01/2022 INITIAL    LONG TERM GOALS:    LTG Name Target Date Goal status  1 Independent with final HEP Baseline: 02/22/2022 INITIAL  2 FOTO score improved to 67 for improved function Baseline: 02/22/2022 INITIAL  3 Lumbar flexion improved to 25% limitation for improved function Baseline: 02/22/2022 INITIAL  4 Report pain < 2/10 with sleeping and driving  Baseline: 04/24/6485 INITIAL      PLAN: PT FREQUENCY: 1x/week   PT DURATION: 6 weeks   PLANNED INTERVENTIONS: Therapeutic exercises, Therapeutic activity, Neuro Muscular re-education, Patient/Family education, Joint mobilization, Dry Needling, Electrical stimulation, Spinal mobilization, Cryotherapy, Moist heat, Taping, Traction, and Manual therapy   PLAN FOR NEXT SESSION: continue extension, possible DN to QL, lumbar paraspinals, multifidi, consider traction          Laureen Abrahams, PT, DPT 01/15/22  11:43 AM

## 2022-01-22 ENCOUNTER — Encounter: Payer: Self-pay | Admitting: Physical Therapy

## 2022-01-22 ENCOUNTER — Other Ambulatory Visit: Payer: Self-pay

## 2022-01-22 ENCOUNTER — Ambulatory Visit (INDEPENDENT_AMBULATORY_CARE_PROVIDER_SITE_OTHER): Payer: 59 | Admitting: Physical Therapy

## 2022-01-22 DIAGNOSIS — R29898 Other symptoms and signs involving the musculoskeletal system: Secondary | ICD-10-CM

## 2022-01-22 DIAGNOSIS — M6281 Muscle weakness (generalized): Secondary | ICD-10-CM

## 2022-01-22 DIAGNOSIS — M5442 Lumbago with sciatica, left side: Secondary | ICD-10-CM

## 2022-01-22 DIAGNOSIS — G8929 Other chronic pain: Secondary | ICD-10-CM | POA: Diagnosis not present

## 2022-01-22 NOTE — Therapy (Signed)
?OUTPATIENT PHYSICAL THERAPY TREATMENT NOTE ? ? ?Patient Name: Gabriel Brooks ?MRN: [id number] ?DOB:May 09, 1976, 46 y.o., male ?Today's Date: 01/22/2022 ? ?PCP: Michael Boston, MD ?REFERRING PROVIDER: Lanae Crumbly, PA-C ? ? PT End of Session - 01/22/22 0849   ? ? Visit Number 3   ? Number of Visits 6   ? Date for PT Re-Evaluation 02/22/22   ? Authorization Type UMR   ? PT Start Time 0800   ? PT Stop Time 0845   ? PT Time Calculation (min) 45 min   ? Activity Tolerance Patient tolerated treatment well   ? Behavior During Therapy [facility] for tasks assessed/performed   ? ?  ?  ? ?  ? ? ? ?Past Medical History:  ?Diagnosis Date  ? Viral myocarditis   ? ?Past Surgical History:  ?Procedure Laterality Date  ? FEMUR SURGERY    ? ?There are no problems to display for this patient. ? ? ?REFERRING DIAG: M54.42 (ICD-10-CM) - Acute left-sided low back pain with left-sided sciatica Eval and treat as needed. Left hip greater trochanteric bursitis, and lumbar radiculopathy. S/p ORIF left femur with IM nail 2007  ? ?THERAPY DIAG:  ?Chronic bilateral low back pain with left-sided sciatica ? ?Muscle weakness (generalized) ? ?Other symptoms and signs involving the musculoskeletal system ? ?PERTINENT HISTORY: ADHD ? ?PRECAUTIONS: None ? ?SUBJECTIVE: reports feeling "good" this morning, mostly has pain when he first stands up after prolonged sitting ?PAIN:  ?Are you having pain? Yes ?NPRS scale: 2/10 ?Pain location: back ?Pain orientation: Left and Lower  ?PAIN TYPE: radiating to distal thigh, mainly in glute ?Pain description: burning  ?Aggravating factors: transitioning from sit to stand, sitting, standing ?Relieving factors: repositioning ? ? ? ?OBJECTIVE:  ?  ?DIAGNOSTIC FINDINGS:  ?CT: "bulging disc" L4-23 Mar 2021 ?  ?PATIENT SURVEYS:  ?01/11/22 FOTO 56 (predicted 67) ?     ?  ?MUSCLE LENGTH: ?01/11/22 Hamstrings: tightness noted bil ?  ?PALPATION: ?01/11/22: mild trigger points noted in bil QL ?  ?LUMBARAROM/PROM ?  ?A/PROM A/PROM  ?01/11/2022   ?Flexion Limited 50%  ?Extension WNL  ?Right lateral flexion WNL  ?Left lateral flexion WNL (Rt side pain)  ?Right rotation WNL ?(Rt side pain)  ?Left rotation WNL  ? (Blank rows = not tested) ?  ?LE MMT: ?  ?MMT Right ?01/11/2022 Left ?01/11/2022  ?Hip flexion 5/5 5/5  ?Hip extension      ?Hip abduction   4/5  ?Hip adduction      ?Hip internal rotation      ?Hip external rotation      ?Knee flexion 5/5 4/5  ?Knee extension 5/5 5/5  ?Ankle dorsiflexion 5/5 4/5  ?Ankle plantarflexion      ?Ankle inversion      ?Ankle eversion      ? (Blank rows = not tested) ?  ?LUMBAR SPECIAL TESTS:  ?01/11/22: Slump test: Positive on Lt ?  ?GAIT: ?WNL ?  ?TODAY'S TREATMENT  ?01/22/22 ?Therapeutic Exercise: ? Aerobic: ?Supine:piriformis stretch 30 sec X 2 figure 4 and 30 sec X2 knee to opposite shoulder ?Prone: ? Seated:slump stretch Lt 3 sec X10 ?Standing:Lt lateral shift correction 10 x 10 sec hold, lumbar extension with forearms on wall 10 x 10  ?Neuromuscular Re-education: ?Manual Therapy:active compression and skilled palpation with DN ?Therapeutic Activity: ?Self Care: ?Trigger Point Dry Needling: DN to bilat lumbar paraspinals, bilat iliocostalis, and bilat QL. Good tolerance to this and twitch responses elicited. ?Modalities:  ? ?01/15/22 ?Therex: ? Lt lateral  shift correction 10 x 10 sec hold ? Standing lumbar extension with forearms on wall 10 x 10 sec hold ? Prone on elbows x 3 min ?  ?Manual: ?Use of tennis ball for self mobilization x5 min; mild tightness noted with pain with L3-5 CPA mobs ?Discussed DN as well as traction as options to try if still having pain next week. ? ?PATIENT EDUCATION:  ?Education details: HEP  ?Person educated: Patient ?Education method: Explanation, Demonstration, and Handouts ?Education comprehension: verbalized understanding, returned demonstration, and needs further education ?  ?  ?HOME EXERCISE PROGRAM: ?Access Code: [email or web address] ?URL: [email or web address] ?Date:  01/11/2022 ?Prepared by: Faustino Congress ?  ?Exercises ?Left Standing Lateral Shift Correction at Wall - Repetitions - 3-6 x daily - 7 x weekly - 1 sets - 10 reps - 10 sec hold ?Prone on Elbows Stretch - 3-6 x daily - 7 x weekly - 1 sets - 1 reps - 3 min hold ?Standing Lumbar Extension at Wall - Forearms - 3-6 x daily - 7 x weekly - 1 sets - 10 reps - 10 sec hold ?Seated Hamstring Stretch - 2 x daily - 7 x weekly - 1 sets - 3 reps - 30 sec hold ?Supine Piriformis Stretch with Foot on Ground - 2 x daily - 7 x weekly - 1 sets - 3 reps - 30 sec hold ?  ?  ?ASSESSMENT: ?  ?CLINICAL IMPRESSION: ?He appears to be making good early progress with PT. I did show him slump stretch and piriformis stretch that he can add into HEP and he shows good understanding of this. We then trialed DN today to lumbar and he had good response from this and noted immediate positive result with less pain and tightness.  ?  ?  ?  ?OBJECTIVE IMPAIRMENTS decreased ROM, decreased strength, increased fascial restrictions, increased muscle spasms, and pain.  ?  ?ACTIVITY LIMITATIONS community activity, driving, occupation, and sleeping .  ?  ?PERSONAL FACTORS 1 comorbidity: ADHD  are also affecting patient's functional outcome.  ?  ?  ?REHAB POTENTIAL: Excellent ?  ?CLINICAL DECISION MAKING: Evolving/moderate complexity ?  ?EVALUATION COMPLEXITY: Moderate ?  ?  ?GOALS: ?Goals reviewed with patient? Yes ?  ?SHORT TERM GOALS: ?  ?STG Name Target Date Goal status  ?1 Independent with initial HEP ?Baseline:  02/01/2022 MET  ?2 Report centralization of symptoms for improved function ?Baseline:  02/01/2022 ongoing  ?  ?LONG TERM GOALS:  ?  ?LTG Name Target Date Goal status  ?1 Independent with final HEP ?Baseline: 02/22/2022 ongoing  ?2 FOTO score improved to 67 for improved function ?Baseline: 02/22/2022 ongoing  ?3 Lumbar flexion improved to 25% limitation for improved function ?Baseline: 02/22/2022 ongoing  ?4 Report pain < 2/10 with sleeping and driving   ?Baseline: 02/22/2022 ongoing  ?  ?  ?PLAN: ?PT FREQUENCY: 1x/week ?  ?PT DURATION: 6 weeks ?  ?PLANNED INTERVENTIONS: Therapeutic exercises, Therapeutic activity, Neuro Muscular re-education, Patient/Family education, Joint mobilization, Dry Needling, Electrical stimulation, Spinal mobilization, Cryotherapy, Moist heat, Taping, Traction, and Manual therapy ?  ?PLAN FOR NEXT SESSION: continue extension, how was DN and consider this again if helpful or may consider traction   ? ?  ? ? ?Elsie Ra, PT, DPT ?01/22/22 8:50 AM ? ? ?  ? ?

## 2022-01-29 ENCOUNTER — Encounter: Payer: Self-pay | Admitting: Physical Therapy

## 2022-01-29 ENCOUNTER — Other Ambulatory Visit: Payer: Self-pay

## 2022-01-29 ENCOUNTER — Ambulatory Visit: Payer: 59 | Admitting: Physical Therapy

## 2022-01-29 DIAGNOSIS — M6281 Muscle weakness (generalized): Secondary | ICD-10-CM

## 2022-01-29 DIAGNOSIS — M5442 Lumbago with sciatica, left side: Secondary | ICD-10-CM | POA: Diagnosis not present

## 2022-01-29 DIAGNOSIS — G8929 Other chronic pain: Secondary | ICD-10-CM

## 2022-01-29 DIAGNOSIS — R29898 Other symptoms and signs involving the musculoskeletal system: Secondary | ICD-10-CM | POA: Diagnosis not present

## 2022-01-29 NOTE — Therapy (Signed)
?OUTPATIENT PHYSICAL THERAPY TREATMENT NOTE ? ? ?Patient Name: Gabriel Brooks ?MRN: [id number] ?DOB:1976-08-31, 46 y.o., male ?Today's Date: 01/29/2022 ? ?PCP: Michael Boston, MD ?REFERRING PROVIDER: Lanae Crumbly, PA-C ? ? PT End of Session - 01/29/22 0805   ? ? Visit Number 4   ? Number of Visits 6   ? Date for PT Re-Evaluation 02/22/22   ? Authorization Type UMR   ? PT Start Time 0800   ? PT Stop Time 0840   ? PT Time Calculation (min) 40 min   ? Activity Tolerance Patient tolerated treatment well   ? Behavior During Therapy [facility] for tasks assessed/performed   ? ?  ?  ? ?  ? ? ? ? ?Past Medical History:  ?Diagnosis Date  ? Viral myocarditis   ? ?Past Surgical History:  ?Procedure Laterality Date  ? FEMUR SURGERY    ? ?There are no problems to display for this patient. ? ? ?REFERRING DIAG: M54.42 (ICD-10-CM) - Acute left-sided low back pain with left-sided sciatica Eval and treat as needed. Left hip greater trochanteric bursitis, and lumbar radiculopathy. S/p ORIF left femur with IM nail 2007  ? ?THERAPY DIAG:  ?Chronic bilateral low back pain with left-sided sciatica ? ?Muscle weakness (generalized) ? ?Other symptoms and signs involving the musculoskeletal system ? ?PERTINENT HISTORY: ADHD ? ?PRECAUTIONS: None ? ?SUBJECTIVE: still doing better; lifted suitcases this weekend and has had increased pain  ? ?PAIN:  ?Are you having pain? Yes ?NPRS scale: 6/10 ?Pain location: back ?Pain orientation: Left and Lower; Rt QL ?PAIN TYPE: radiating to distal thigh, mainly in glute ?Pain description: burning  ?Aggravating factors: transitioning from sit to stand, sitting, standing ?Relieving factors: repositioning ? ? ? ?OBJECTIVE:  ?  ?DIAGNOSTIC FINDINGS:  ?CT: "bulging disc" L4-23 Mar 2021 ?  ?PATIENT SURVEYS:  ?01/11/22 FOTO 56 (predicted 55) ?     ?  ?MUSCLE LENGTH: ?01/11/22 Hamstrings: tightness noted bil ?  ?PALPATION: ?01/11/22: mild trigger points noted in bil QL ?  ?LUMBARAROM/PROM ?  ?A/PROM A/PROM  ?01/11/2022  ?Flexion  Limited 50%  ?Extension WNL  ?Right lateral flexion WNL  ?Left lateral flexion WNL (Rt side pain)  ?Right rotation WNL ?(Rt side pain)  ?Left rotation WNL  ? (Blank rows = not tested) ?  ?LE MMT: ?  ?MMT Right ?01/11/2022 Left ?01/11/2022  ?Hip flexion 5/5 5/5  ?Hip extension      ?Hip abduction   4/5  ?Hip adduction      ?Hip internal rotation      ?Hip external rotation      ?Knee flexion 5/5 4/5  ?Knee extension 5/5 5/5  ?Ankle dorsiflexion 5/5 4/5  ?Ankle plantarflexion      ?Ankle inversion      ?Ankle eversion      ? (Blank rows = not tested) ?  ?LUMBAR SPECIAL TESTS:  ?01/11/22: Slump test: Positive on Lt ?  ?GAIT: ?WNL ?  ?TODAY'S TREATMENT  ?01/29/22 ?Therapeutic Exercise: ? Aerobic: NuStep L6 x 5 min ?Supine:piriformis stretch 30 sec X 2 figure 4 and 30 sec X2 knee to opposite shoulder ? Seated:slump stretch Lt 3 sec X10 ?Standing:Lt lateral shift correction 10 x 10 sec hold, lumbar extension with forearms on wall 10 x 10  ?Manual Therapy:active compression and skilled palpation with DN ?Trigger Point Dry Needling: DN to Rt lumbar paraspinals and bilat QL. Good tolerance to this and twitch responses elicited. ? ?01/22/22 ?Therapeutic Exercise: ? Aerobic: ?Supine:piriformis stretch 30 sec X 2  figure 4 and 30 sec X2 knee to opposite shoulder ?Prone: ? Seated:slump stretch Lt 3 sec X10 ?Standing:Lt lateral shift correction 10 x 10 sec hold, lumbar extension with forearms on wall 10 x 10  ?Neuromuscular Re-education: ?Manual Therapy:active compression and skilled palpation with DN ?Therapeutic Activity: ?Self Care: ?Trigger Point Dry Needling: DN to bilat lumbar paraspinals, bilat iliocostalis, and bilat QL. Good tolerance to this and twitch responses elicited. ?Modalities:  ? ?01/15/22 ?Therex: ? Lt lateral shift correction 10 x 10 sec hold ? Standing lumbar extension with forearms on wall 10 x 10 sec hold ? Prone on elbows x 3 min ?  ?Manual: ?Use of tennis ball for self mobilization x5 min; mild tightness noted  with pain with L3-5 CPA mobs ?Discussed DN as well as traction as options to try if still having pain next week. ? ?PATIENT EDUCATION:  ?Education details: HEP  ?Person educated: Patient ?Education method: Explanation, Demonstration, and Handouts ?Education comprehension: verbalized understanding, returned demonstration, and needs further education ?  ?  ?HOME EXERCISE PROGRAM: ?Access Code: [email or web address] ?URL: [email or web address] ?Date: 01/11/2022 ?Prepared by: Faustino Congress ?  ?Exercises ?Left Standing Lateral Shift Correction at Wall - Repetitions - 3-6 x daily - 7 x weekly - 1 sets - 10 reps - 10 sec hold ?Prone on Elbows Stretch - 3-6 x daily - 7 x weekly - 1 sets - 1 reps - 3 min hold ?Standing Lumbar Extension at Wall - Forearms - 3-6 x daily - 7 x weekly - 1 sets - 10 reps - 10 sec hold ?Seated Hamstring Stretch - 2 x daily - 7 x weekly - 1 sets - 3 reps - 30 sec hold ?Supine Piriformis Stretch with Foot on Ground - 2 x daily - 7 x weekly - 1 sets - 3 reps - 30 sec hold ?  ?  ?ASSESSMENT: ?  ?CLINICAL IMPRESSION: ?Pt reported decreased pain following session today with manual therapy and DN.  Overall progressing well and anticipate hold or d/c next 1-2 visits. ? ? ?  ?OBJECTIVE IMPAIRMENTS decreased ROM, decreased strength, increased fascial restrictions, increased muscle spasms, and pain.  ?  ?ACTIVITY LIMITATIONS community activity, driving, occupation, and sleeping .  ?  ?PERSONAL FACTORS 1 comorbidity: ADHD  are also affecting patient's functional outcome.  ?  ?  ?REHAB POTENTIAL: Excellent ?  ?CLINICAL DECISION MAKING: Evolving/moderate complexity ?  ?EVALUATION COMPLEXITY: Moderate ?  ?  ?GOALS: ?Goals reviewed with patient? Yes ?  ?SHORT TERM GOALS: ?  ?STG Name Target Date Goal status  ?1 Independent with initial HEP ?Baseline:  02/01/2022 MET  ?2 Report centralization of symptoms for improved function ?Baseline:  02/01/2022 ongoing  ?  ?LONG TERM GOALS:  ?  ?LTG Name Target Date Goal  status  ?1 Independent with final HEP ?Baseline: 02/22/2022 ongoing  ?2 FOTO score improved to 67 for improved function ?Baseline: 02/22/2022 ongoing  ?3 Lumbar flexion improved to 25% limitation for improved function ?Baseline: 02/22/2022 ongoing  ?4 Report pain < 2/10 with sleeping and driving  ?Baseline: 02/22/2022 ongoing  ?  ?  ?PLAN: ?PT FREQUENCY: 1x/week ?  ?PT DURATION: 6 weeks ?  ?PLANNED INTERVENTIONS: Therapeutic exercises, Therapeutic activity, Neuro Muscular re-education, Patient/Family education, Joint mobilization, Dry Needling, Electrical stimulation, Spinal mobilization, Cryotherapy, Moist heat, Taping, Traction, and Manual therapy ?  ?PLAN FOR NEXT SESSION: add in core/hip/back strengthening continue extension, continue DN/manual PRN   ? ?  ? ? ?Laureen Abrahams, PT, DPT ?01/29/22 8:41 AM ? ? ? ?  ? ?

## 2022-02-05 ENCOUNTER — Encounter: Payer: 59 | Admitting: Physical Therapy

## 2022-02-05 NOTE — Therapy (Incomplete)
?OUTPATIENT PHYSICAL THERAPY TREATMENT NOTE ? ? ?Patient Name: Gabriel Brooks ?MRN: [id number] ?DOB:1976-08-19, 46 y.o., male ?Today's Date: 02/05/2022 ? ?PCP: Michael Boston, MD ?REFERRING PROVIDER: Lanae Crumbly, PA-C ? ? ? ? ? ? ?Past Medical History:  ?Diagnosis Date  ? Viral myocarditis   ? ?Past Surgical History:  ?Procedure Laterality Date  ? FEMUR SURGERY    ? ?There are no problems to display for this patient. ? ? ?REFERRING DIAG: M54.42 (ICD-10-CM) - Acute left-sided low back pain with left-sided sciatica Eval and treat as needed. Left hip greater trochanteric bursitis, and lumbar radiculopathy. S/p ORIF left femur with IM nail 2007  ? ?THERAPY DIAG:  ?No diagnosis found. ? ?PERTINENT HISTORY: ADHD ? ?PRECAUTIONS: None ? ?SUBJECTIVE: *** still doing better; lifted suitcases this weekend and has had increased pain  ? ?PAIN:  ?Are you having pain? Yes ?NPRS scale: ***/10 ?Pain location: back ?Pain orientation: Left and Lower; Rt QL ?PAIN TYPE: radiating to distal thigh, mainly in glute ?Pain description: burning  ?Aggravating factors: transitioning from sit to stand, sitting, standing ?Relieving factors: repositioning ? ? ? ?OBJECTIVE:  ?  ?DIAGNOSTIC FINDINGS:  ?CT: "bulging disc" L4-23 Mar 2021 ?  ?PATIENT SURVEYS:  ?01/11/22 FOTO 56 (predicted 69) ?     ?  ?MUSCLE LENGTH: ?01/11/22 Hamstrings: tightness noted bil ?  ?PALPATION: ?01/11/22: mild trigger points noted in bil QL ?  ?LUMBARAROM/PROM ?  ?A/PROM A/PROM  ?01/11/2022  ?Flexion Limited 50%  ?Extension WNL  ?Right lateral flexion WNL  ?Left lateral flexion WNL (Rt side pain)  ?Right rotation WNL ?(Rt side pain)  ?Left rotation WNL  ? (Blank rows = not tested) ?  ?LE MMT: ?  ?MMT Right ?01/11/2022 Left ?01/11/2022  ?Hip flexion 5/5 5/5  ?Hip extension      ?Hip abduction   4/5  ?Hip adduction      ?Hip internal rotation      ?Hip external rotation      ?Knee flexion 5/5 4/5  ?Knee extension 5/5 5/5  ?Ankle dorsiflexion 5/5 4/5  ?Ankle plantarflexion      ?Ankle  inversion      ?Ankle eversion      ? (Blank rows = not tested) ?  ?LUMBAR SPECIAL TESTS:  ?01/11/22: Slump test: Positive on Lt ?  ?GAIT: ?WNL ?  ?TODAY'S TREATMENT  ?02/05/22 ?*** ? ?01/29/22 ?Therapeutic Exercise: ? Aerobic: NuStep L6 x 5 min ?Supine:piriformis stretch 30 sec X 2 figure 4 and 30 sec X2 knee to opposite shoulder ? Seated:slump stretch Lt 3 sec X10 ?Standing:Lt lateral shift correction 10 x 10 sec hold, lumbar extension with forearms on wall 10 x 10  ?Manual Therapy:active compression and skilled palpation with DN ?Trigger Point Dry Needling: DN to Rt lumbar paraspinals and bilat QL. Good tolerance to this and twitch responses elicited. ? ?01/22/22 ?Therapeutic Exercise: ? Aerobic: ?Supine:piriformis stretch 30 sec X 2 figure 4 and 30 sec X2 knee to opposite shoulder ?Prone: ? Seated:slump stretch Lt 3 sec X10 ?Standing:Lt lateral shift correction 10 x 10 sec hold, lumbar extension with forearms on wall 10 x 10  ?Neuromuscular Re-education: ?Manual Therapy:active compression and skilled palpation with DN ?Therapeutic Activity: ?Self Care: ?Trigger Point Dry Needling: DN to bilat lumbar paraspinals, bilat iliocostalis, and bilat QL. Good tolerance to this and twitch responses elicited. ?Modalities:  ? ?01/15/22 ?Therex: ? Lt lateral shift correction 10 x 10 sec hold ? Standing lumbar extension with forearms on wall 10 x 10 sec hold ?  Prone on elbows x 3 min ?  ?Manual: ?Use of tennis ball for self mobilization x5 min; mild tightness noted with pain with L3-5 CPA mobs ?Discussed DN as well as traction as options to try if still having pain next week. ? ?PATIENT EDUCATION:  ?Education details: HEP  ?Person educated: Patient ?Education method: Explanation, Demonstration, and Handouts ?Education comprehension: verbalized understanding, returned demonstration, and needs further education ?  ?  ?HOME EXERCISE PROGRAM: ?Access Code: [email or web address] ?URL: [email or web address] ?Date: 01/11/2022 ?Prepared  by: Faustino Congress ?  ?Exercises ?Left Standing Lateral Shift Correction at Wall - Repetitions - 3-6 x daily - 7 x weekly - 1 sets - 10 reps - 10 sec hold ?Prone on Elbows Stretch - 3-6 x daily - 7 x weekly - 1 sets - 1 reps - 3 min hold ?Standing Lumbar Extension at Wall - Forearms - 3-6 x daily - 7 x weekly - 1 sets - 10 reps - 10 sec hold ?Seated Hamstring Stretch - 2 x daily - 7 x weekly - 1 sets - 3 reps - 30 sec hold ?Supine Piriformis Stretch with Foot on Ground - 2 x daily - 7 x weekly - 1 sets - 3 reps - 30 sec hold ?  ?  ?ASSESSMENT: ?  ?CLINICAL IMPRESSION: ?*** ?Pt reported decreased pain following session today with manual therapy and DN.  Overall progressing well and anticipate hold or d/c next 1-2 visits. ? ? ?  ?OBJECTIVE IMPAIRMENTS decreased ROM, decreased strength, increased fascial restrictions, increased muscle spasms, and pain.  ?  ?ACTIVITY LIMITATIONS community activity, driving, occupation, and sleeping .  ?  ?PERSONAL FACTORS 1 comorbidity: ADHD  are also affecting patient's functional outcome.  ?  ?  ?REHAB POTENTIAL: Excellent ?  ?CLINICAL DECISION MAKING: Evolving/moderate complexity ?  ?EVALUATION COMPLEXITY: Moderate ?  ?  ?GOALS: ?Goals reviewed with patient? Yes ?  ?SHORT TERM GOALS: ?  ?STG Name Target Date Goal status  ?1 Independent with initial HEP ?Baseline:  02/01/2022 MET  ?2 Report centralization of symptoms for improved function ?Baseline:  02/01/2022 ongoing  ?  ?LONG TERM GOALS:  ?  ?LTG Name Target Date Goal status  ?1 Independent with final HEP ?Baseline: 02/22/2022 ongoing  ?2 FOTO score improved to 67 for improved function ?Baseline: 02/22/2022 ongoing  ?3 Lumbar flexion improved to 25% limitation for improved function ?Baseline: 02/22/2022 ongoing  ?4 Report pain < 2/10 with sleeping and driving  ?Baseline: 02/22/2022 ongoing  ?  ?  ?PLAN: ?PT FREQUENCY: 1x/week ?  ?PT DURATION: 6 weeks ?  ?PLANNED INTERVENTIONS: Therapeutic exercises, Therapeutic activity, Neuro Muscular  re-education, Patient/Family education, Joint mobilization, Dry Needling, Electrical stimulation, Spinal mobilization, Cryotherapy, Moist heat, Taping, Traction, and Manual therapy ?  ?PLAN FOR NEXT SESSION: *** add in core/hip/back strengthening continue extension, continue DN/manual PRN   ? ?  ? ? ?Laureen Abrahams, PT, DPT ?02/05/22 7:31 AM ? ? ? ?  ? ?

## 2022-02-12 ENCOUNTER — Other Ambulatory Visit: Payer: Self-pay

## 2022-02-12 ENCOUNTER — Ambulatory Visit: Payer: 59 | Admitting: Physical Therapy

## 2022-02-12 ENCOUNTER — Encounter: Payer: Self-pay | Admitting: Physical Therapy

## 2022-02-12 DIAGNOSIS — R29898 Other symptoms and signs involving the musculoskeletal system: Secondary | ICD-10-CM | POA: Diagnosis not present

## 2022-02-12 DIAGNOSIS — G8929 Other chronic pain: Secondary | ICD-10-CM

## 2022-02-12 DIAGNOSIS — M5442 Lumbago with sciatica, left side: Secondary | ICD-10-CM

## 2022-02-12 DIAGNOSIS — M6281 Muscle weakness (generalized): Secondary | ICD-10-CM | POA: Diagnosis not present

## 2022-02-12 NOTE — Therapy (Addendum)
?OUTPATIENT PHYSICAL THERAPY TREATMENT NOTE ? ? ?Patient Name: Gabriel Brooks ?MRN: [id number] ?DOB:09-Dec-1975, 46 y.o., male ?Today's Date: 02/12/2022 ? ?PCP: Michael Boston, MD ?REFERRING PROVIDER: Lanae Crumbly, PA-C ? ? PT End of Session - 02/12/22 0807   ? ? Visit Number 5   ? Number of Visits 6   ? Date for PT Re-Evaluation 02/22/22   ? Authorization Type UMR   ? PT Start Time 0800   ? PT Stop Time 0845   ? PT Time Calculation (min) 45 min   ? Activity Tolerance Patient tolerated treatment well   ? Behavior During Therapy [facility] for tasks assessed/performed   ? ?  ?  ? ?  ? ? ? ? ?Past Medical History:  ?Diagnosis Date  ? Viral myocarditis   ? ?Past Surgical History:  ?Procedure Laterality Date  ? FEMUR SURGERY    ? ?There are no problems to display for this patient. ? ? ?REFERRING DIAG: M54.42 (ICD-10-CM) - Acute left-sided low back pain with left-sided sciatica Eval and treat as needed. Left hip greater trochanteric bursitis, and lumbar radiculopathy. S/p ORIF left femur with IM nail 2007  ? ?THERAPY DIAG:  ?Chronic bilateral low back pain with left-sided sciatica ? ?Muscle weakness (generalized) ? ?Other symptoms and signs involving the musculoskeletal system ? ?PERTINENT HISTORY: ADHD ? ?PRECAUTIONS: None ? ?SUBJECTIVE: still doing better than we he started PT however has not made much progress over the last week after he flared things up carrying suitcase up 3 floors. He reports radicular pain down left leg to his last 2 toes.  ? ?PAIN:  ?Are you having pain? Yes ?NPRS scale: 5/10 ?Pain location: back ?Pain orientation: Left and Lower; Rt QL ?PAIN TYPE: radiating to distal thigh, mainly in glute ?Pain description: burning  ?Aggravating factors: transitioning from sit to stand, sitting, standing ?Relieving factors: repositioning ? ? ? ?OBJECTIVE:  ?  ?DIAGNOSTIC FINDINGS:  ?CT: "bulging disc" L4-23 Mar 2021 ?  ?PATIENT SURVEYS:  ?01/11/22 FOTO 56 (predicted 41) ?     ?  ?MUSCLE LENGTH: ?01/11/22 Hamstrings:  tightness noted bil ?  ?PALPATION: ?01/11/22: mild trigger points noted in bil QL ?  ?LUMBARAROM/PROM ?  ?A/PROM A/PROM  ?01/11/2022   ?Flexion Limited 50%   ?Extension WNL   ?Right lateral flexion WNL   ?Left lateral flexion WNL (Rt side pain)   ?Right rotation WNL ?(Rt side pain)   ?Left rotation WNL   ? (Blank rows = not tested) ?  ?LE MMT: ?  ?MMT Right ?01/11/2022 Left ?01/11/2022  ?Hip flexion 5/5 5/5  ?Hip extension      ?Hip abduction   4/5  ?Hip adduction      ?Hip internal rotation      ?Hip external rotation      ?Knee flexion 5/5 4/5  ?Knee extension 5/5 5/5  ?Ankle dorsiflexion 5/5 4/5  ?Ankle plantarflexion      ?Ankle inversion      ?Ankle eversion      ? (Blank rows = not tested) ?  ?LUMBAR SPECIAL TESTS:  ?01/11/22: Slump test: Positive on Lt ?  ?GAIT: ?WNL ?  ?TODAY'S TREATMENT  ?02/12/22 ?Therapeutic Exercise: ? Aerobic: ?Supine:piriformis stretch 30 sec X 2 bilat. Bridges 5 sec 2x10 ?Prone:prone on elbows 2 min then prone press ups holding 5 sec 2X10 ? Seated:slump stretch Lt 3 sec X10 ? ?Modalities: Lumbar mechanical traction 80-65# intermittent X 20 min total ? ?01/29/22 ?Therapeutic Exercise: ? Aerobic: NuStep L6 x 5  min ?Supine:piriformis stretch 30 sec X 2 figure 4 and 30 sec X2 knee to opposite shoulder ? Seated:slump stretch Lt 3 sec X10 ?Standing:Lt lateral shift correction 10 x 10 sec hold, lumbar extension with forearms on wall 10 x 10  ?Manual Therapy:active compression and skilled palpation with DN ?Trigger Point Dry Needling: DN to Rt lumbar paraspinals and bilat QL. Good tolerance to this and twitch responses elicited. ? ?01/22/22 ?Therapeutic Exercise: ? Aerobic: ?Supine:piriformis stretch 30 sec X 2 figure 4 and 30 sec X2 knee to opposite shoulder ?Prone: ? Seated:slump stretch Lt 3 sec X10 ?Standing:Lt lateral shift correction 10 x 10 sec hold, lumbar extension with forearms on wall 10 x 10  ?Neuromuscular Re-education: ?Manual Therapy:active compression and skilled palpation with  DN ?Therapeutic Activity: ?Self Care: ?Trigger Point Dry Needling: DN to bilat lumbar paraspinals, bilat iliocostalis, and bilat QL. Good tolerance to this and twitch responses elicited. ?Modalities:  ? ? ?PATIENT EDUCATION:  ?Education details: HEP  ?Person educated: Patient ?Education method: Explanation, Demonstration, and Handouts ?Education comprehension: verbalized understanding, returned demonstration, and needs further education ?  ?  ?HOME EXERCISE PROGRAM: ?Access Code: [email or web address] ?URL: [email or web address] ?Date: 02/12/2022 ?Prepared by: Elsie Ra ? ?Exercises ?- Left Standing Lateral Shift Correction at Wall - Repetitions  - 3-6 x daily - 7 x weekly - 1 sets - 10 reps - 10 sec hold ?- Prone on Elbows Stretch  - 3-6 x daily - 7 x weekly - 1 sets - 1 reps - 3 min hold ?- Prone Press Up  - 2 x daily - 6 x weekly - 2-3 sets - 10 reps - 5 sec hold ?- Supine Bridge  - 2 x daily - 6 x weekly - 2-3 sets - 10 reps - 5 hold ?- Standing Lumbar Extension at Wall - Forearms  - 3-6 x daily - 7 x weekly - 1 sets - 10 reps - 10 sec hold ?- Supine Piriformis Stretch with Foot on Ground  - 2 x daily - 7 x weekly - 1 sets - 3 reps - 30 sec hold ?- Seated Slump Nerve Glide  - 2 x daily - 6 x weekly - 1-2 sets - 10 reps - 3 hold ?  ?  ?ASSESSMENT: ?  ?CLINICAL IMPRESSION: ?He still has some pain and radiculopathy that was reaggregated lifting a suitcase. I did trial mechanical traction today and added prone press ups and bridges today to add into HEP as well to see if all of this will help reduce his lumbar radiculopathy symptoms. ? ?  ?OBJECTIVE IMPAIRMENTS decreased ROM, decreased strength, increased fascial restrictions, increased muscle spasms, and pain.  ?  ?ACTIVITY LIMITATIONS community activity, driving, occupation, and sleeping .  ?  ?PERSONAL FACTORS 1 comorbidity: ADHD  are also affecting patient's functional outcome.  ?  ?  ?REHAB POTENTIAL: Excellent ?  ?CLINICAL DECISION MAKING:  Evolving/moderate complexity ?  ?EVALUATION COMPLEXITY: Moderate ?  ?  ?GOALS: ?Goals reviewed with patient? Yes ?  ?SHORT TERM GOALS: ?  ?STG Name Target Date Goal status  ?1 Independent with initial HEP ?Baseline:  02/01/2022 MET  ?2 Report centralization of symptoms for improved function ?Baseline:  02/01/2022 ongoing  ?  ?LONG TERM GOALS:  ?  ?LTG Name Target Date Goal status  ?1 Independent with final HEP ?Baseline: 02/22/2022 ongoing  ?2 FOTO score improved to 67 for improved function ?Baseline: 02/22/2022 ongoing  ?3 Lumbar flexion improved to 25% limitation for improved function ?Baseline: 02/22/2022 ongoing  ?4  Report pain < 2/10 with sleeping and driving  ?Baseline: 02/22/2022 ongoing  ?  ?  ?PLAN: ?PT FREQUENCY: 1x/week ?  ?PT DURATION: 6 weeks ?  ?PLANNED INTERVENTIONS: Therapeutic exercises, Therapeutic activity, Neuro Muscular re-education, Patient/Family education, Joint mobilization, Dry Needling, Electrical stimulation, Spinal mobilization, Cryotherapy, Moist heat, Taping, Traction, and Manual therapy ?  ?PLAN FOR NEXT SESSION: how was traction? He will need recert vs DC assessment core/hip/back strengthening continue extension, continue DN/manual PRN   ? ?  ?Elsie Ra, PT, DPT ?02/12/22 8:10 AM ? ? ? ? ? ?  ? ?

## 2022-02-19 ENCOUNTER — Encounter: Payer: 59 | Admitting: Physical Therapy

## 2022-02-27 ENCOUNTER — Ambulatory Visit (INDEPENDENT_AMBULATORY_CARE_PROVIDER_SITE_OTHER): Payer: Self-pay | Admitting: Adult Health

## 2022-02-27 DIAGNOSIS — Z91199 Patient's noncompliance with other medical treatment and regimen due to unspecified reason: Secondary | ICD-10-CM

## 2022-02-27 NOTE — Progress Notes (Signed)
Patient no show appointment. ? ?

## 2022-03-22 ENCOUNTER — Encounter: Payer: 59 | Admitting: Physical Therapy

## 2022-03-22 ENCOUNTER — Other Ambulatory Visit (HOSPITAL_COMMUNITY): Payer: Self-pay

## 2022-03-22 MED ORDER — AZITHROMYCIN 250 MG PO TABS
ORAL_TABLET | ORAL | 0 refills | Status: DC
  Filled 2022-03-22: qty 6, 5d supply, fill #0

## 2022-03-23 ENCOUNTER — Other Ambulatory Visit (HOSPITAL_COMMUNITY): Payer: Self-pay

## 2022-03-23 ENCOUNTER — Ambulatory Visit (INDEPENDENT_AMBULATORY_CARE_PROVIDER_SITE_OTHER): Payer: 59 | Admitting: Adult Health

## 2022-03-23 ENCOUNTER — Encounter: Payer: Self-pay | Admitting: Adult Health

## 2022-03-23 DIAGNOSIS — F909 Attention-deficit hyperactivity disorder, unspecified type: Secondary | ICD-10-CM | POA: Diagnosis not present

## 2022-03-23 MED ORDER — AMPHETAMINE-DEXTROAMPHETAMINE 5 MG PO TABS
5.0000 mg | ORAL_TABLET | Freq: Every day | ORAL | 0 refills | Status: DC
  Filled 2022-03-23 – 2022-03-30 (×2): qty 90, 90d supply, fill #0

## 2022-03-23 NOTE — Addendum Note (Signed)
Addended by: Aloha Gell on: 03/23/2022 08:59 AM ? ? Modules accepted: Level of Service ? ?

## 2022-03-23 NOTE — Progress Notes (Signed)
Gabriel Brooks ?[id number] ?[birth year] ?46 y.o. ? ?Virtual Visit via Telephone Note ? ?I connected with pt on 03/23/22 at  8:20 AM EDT by telephone and verified that I am speaking with the correct person using two identifiers. ?  ?I discussed the limitations, risks, security and privacy concerns of performing an evaluation and management service by telephone and the availability of in person appointments. I also discussed with the patient that there may be a patient responsible charge related to this service. The patient expressed understanding and agreed to proceed. ?  ?I discussed the assessment and treatment plan with the patient. The patient was provided an opportunity to ask questions and all were answered. The patient agreed with the plan and demonstrated an understanding of the instructions. ?  ?The patient was advised to call back or seek an in-person evaluation if the symptoms worsen or if the condition fails to improve as anticipated. ? ?I provided 10 minutes of non-face-to-face time during this encounter.  The patient was located at home.  The provider was located at [place]. ? ? ?Gabriel Gell, NP ? ? ?Subjective:  ? ?Patient ID:  Gabriel Brooks is a 46 y.o. (DOB 31-Jan-1976) male. ? ?Chief Complaint: No chief complaint on file. ? ? ?HPI ?Gabriel Brooks presents for follow-up of ADHD. ? ?Describes mood today as "ok". Pleasant. Mood symptoms - denies depression, anxiety, and irritability. Stating "I'm doing alright". Feels like Adderall 41m daily continues to works well. Family doing well. Stable interest and motivation. Taking medications as prescribed.  ? ? ?Energy levels stable. Active, does not have a regular exercise routine. Playing tennis and walking. ?Enjoys some usual interests and activities. Married. Lives with wife and 2 children. Family local. Spending time with family. ?Appetite adequate. Weight 161 pounds. ?Sleeps well most nights. Averages 8 hours. ?Focus and concentration stable -  taking Adderall 563mdaily. Completing tasks. Managing aspects of household. 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 ?Denies SI or HI.  ?Denies AH or VH. ?Denies self harm. ?Denies substance use. ? ?Previous medication trials: Denies ? ? ?Review of Systems:  ?Review of Systems  ?Musculoskeletal:  Negative for gait problem.  ?Neurological:  Negative for tremors.  ?Psychiatric/Behavioral:    ?     Please refer to HPI  ? ?Medications: I have reviewed the patient's current medications. ? ?Current Outpatient Medications  ?Medication Sig Dispense Refill  ? amphetamine-dextroamphetamine (ADDERALL) 15 MG tablet Take 1 tablet by mouth 2 (two) times daily. 60 tablet 0  ? amphetamine-dextroamphetamine (ADDERALL) 5 MG tablet Take 1 tablet  by mouth daily. 90 tablet 0  ? azithromycin (ZITHROMAX Z-PAK) 250 MG tablet Take as directed for 5 days 6 tablet 0  ? betamethasone dipropionate 0.05 % cream Apply a thin layer on affected areas 2 times a day for 3 weeks then repeat as needed 45 g 0  ? COVID-19 At Home Antigen Test ([facility]) KIT Use as directed within package instructions 4 each 0  ? fluconazole (DIFLUCAN) 200 MG tablet Take 2 tablets bymouth now and repeat in 1 week. 4 tablet 0  ? ketorolac (TORADOL) 10 MG tablet Take 1 tablet (10 mg total) by mouth every 6 (six) hours as needed. 10 tablet 0  ? methylPREDNISolone (MEDROL) 4 MG tablet 6 day taper to be taken as directed. (Patient not taking: Reported on 01/11/2022) 21 tablet 0  ? sodium fluoride (PREVIDENT 5000 DRY MOUTH) 1.1 % GEL dental gel  Brush on teeth 2 times a day for 2 mins each time, spit out as much as possible then DO NOT swish, eat, or drink for 78mn 56 g 12  ? tamsulosin (FLOMAX) 0.4 MG CAPS capsule Take 1 capsule by mouth daily 30 capsule 0  ? ?No current facility-administered medications for this visit.  ? ? ?Medication Side Effects: None ? ?Allergies:  ?Allergies  ?Allergen Reactions  ? Penicillins Other (See  Comments)  ?  Unknown childhood reaction  ? ? ?Past Medical History:  ?Diagnosis Date  ? Viral myocarditis   ? ? ?No family history on file. ? ?Social History  ? ?Socioeconomic History  ? Marital status: Married  ?  Spouse name: Not on file  ? Number of children: Not on file  ? Years of education: Not on file  ? Highest education level: Not on file  ?Occupational History  ? Not on file  ?Tobacco Use  ? Smoking status: Never  ? Smokeless tobacco: Never  ?Substance and Sexual Activity  ? Alcohol use: Yes  ? Drug use: No  ? Sexual activity: Not on file  ?Other Topics Concern  ? Not on file  ?Social History Narrative  ? Not on file  ? ?Social Determinants of Health  ? ?Financial Resource Strain: Not on file  ?Food Insecurity: Not on file  ?Transportation Needs: Not on file  ?Physical Activity: Not on file  ?Stress: Not on file  ?Social Connections: Not on file  ?Intimate Partner Violence: Not on file  ? ? ?Past Medical History, Surgical history, Social history, and Family history were reviewed and updated as appropriate.  ? ?Please see review of systems for further details on the patient's review from today.  ? ?Objective:  ? ?Physical Exam:  ?There were no vitals taken for this visit. ? ?Physical Exam ?Constitutional:   ?   General: He is not in acute distress. ?Musculoskeletal:     ?   General: No deformity.  ?Neurological:  ?   Mental Status: He is alert and oriented to person, place, and time.  ?   Coordination: Coordination normal.  ?Psychiatric:     ?   Attention and Perception: Attention and perception normal. He does not perceive auditory or visual hallucinations.     ?   Mood and Affect: Mood normal. Mood is not anxious or depressed. Affect is not labile, blunt, angry or inappropriate.     ?   Speech: Speech normal.     ?   Behavior: Behavior normal.     ?   Thought Content: Thought content normal. Thought content is not paranoid or delusional. Thought content does not include homicidal or suicidal ideation.  Thought content does not include homicidal or suicidal plan.     ?   Cognition and Memory: Cognition and memory normal.     ?   Judgment: Judgment normal.  ?   Comments: Insight intact  ? ? ?Lab Review:  ?   ?Component Value Date/Time  ? NA 138 03/25/2021 0855  ? K 3.9 03/25/2021 0855  ? CL 105 03/25/2021 0855  ? CO2 28 03/25/2021 0855  ? GLUCOSE 110 (H) 03/25/2021 0855  ? BUN 15 03/25/2021 0855  ? CREATININE 1.24 03/25/2021 0855  ? CALCIUM 9.5 03/25/2021 0855  ? PROT 7.5 03/25/2021 0855  ? ALBUMIN 4.2 03/25/2021 0855  ? AST 22 03/25/2021 0855  ? ALT 26 03/25/2021 0855  ? ALKPHOS 42 03/25/2021 0855  ? BILITOT 0.5 03/25/2021 0855  ?  GFRNONAA >60 03/25/2021 0855  ? GFRAA >60 06/16/2020 1548  ? ? ?   ?Component Value Date/Time  ? WBC 6.4 03/25/2021 0855  ? RBC 4.90 03/25/2021 0855  ? HGB 14.3 03/25/2021 0855  ? HCT 43.4 03/25/2021 0855  ? PLT 311 03/25/2021 0855  ? MCV 88.6 03/25/2021 0855  ? MCH 29.2 03/25/2021 0855  ? MCHC 32.9 03/25/2021 0855  ? RDW 12.9 03/25/2021 0855  ? LYMPHSABS 1.6 03/25/2021 0855  ? MONOABS 0.4 03/25/2021 0855  ? EOSABS 0.1 03/25/2021 0855  ? BASOSABS 0.0 03/25/2021 0855  ? ? ?No results found for: POCLITH, LITHIUM  ? ?No results found for: PHENYTOIN, PHENOBARB, VALPROATE, CBMZ  ? ?.res ?Assessment: Plan:   ? ?Plan: ? ?PDMP reviewed ? ?1. Adderall 48m daily  ? ?Monitor BP between visits ? ?RTC 6 months ? ?Patient advised to contact office with any questions, adverse effects, or acute worsening in signs and symptoms. ? ?Discussed potential benefits, risks, and side effects of stimulants with patient to include increased heart rate, palpitations, insomnia, increased anxiety, increased irritability, or decreased appetite.  Instructed patient to contact office if experiencing any significant tolerability issues. ? ?Diagnoses and all orders for this visit: ? ?Attention deficit hyperactivity disorder (ADHD), unspecified ADHD type ?-     amphetamine-dextroamphetamine (ADDERALL) 5 MG tablet; Take 1  tablet  by mouth daily. ? ?  ?Please see After Visit Summary for patient specific instructions. ? ?Future Appointments  ?Date Time Provider [employer] [employer] ?04/09/2022  8:45 AM MLaureen Abrahams PT [facility]-

## 2022-03-30 ENCOUNTER — Other Ambulatory Visit (HOSPITAL_COMMUNITY): Payer: Self-pay

## 2022-04-09 ENCOUNTER — Encounter: Payer: Self-pay | Admitting: Physical Therapy

## 2022-04-09 ENCOUNTER — Ambulatory Visit: Payer: 59 | Admitting: Physical Therapy

## 2022-04-09 DIAGNOSIS — M6281 Muscle weakness (generalized): Secondary | ICD-10-CM | POA: Diagnosis not present

## 2022-04-09 DIAGNOSIS — G8929 Other chronic pain: Secondary | ICD-10-CM | POA: Diagnosis not present

## 2022-04-09 DIAGNOSIS — M5442 Lumbago with sciatica, left side: Secondary | ICD-10-CM

## 2022-04-09 DIAGNOSIS — R29898 Other symptoms and signs involving the musculoskeletal system: Secondary | ICD-10-CM

## 2022-04-09 NOTE — Therapy (Signed)
OUTPATIENT PHYSICAL THERAPY TREATMENT NOTE   Patient Name: Gabriel Brooks MRN: [id number] DOB:08-08-1976, 46 y.o., male Today's Date: 04/09/2022  PCP: Michael Boston, MD REFERRING PROVIDER: Lanae Crumbly, PA-C   PT End of Session - 04/09/22 [phone]     Visit Number 6    Number of Visits 20    Date for PT Re-Evaluation 06/04/22    Authorization Type UMR    PT Start Time 0847    PT Stop Time 0930    PT Time Calculation (min) 43 min    Activity Tolerance Patient tolerated treatment well    Behavior During Therapy [facility] for tasks assessed/performed               Past Medical History:  Diagnosis Date   Viral myocarditis    Past Surgical History:  Procedure Laterality Date   FEMUR SURGERY     There are no problems to display for this patient.   REFERRING DIAG: M54.42 (ICD-10-CM) - Acute left-sided low back pain with left-sided sciatica Eval and treat as needed. Left hip greater trochanteric bursitis, and lumbar radiculopathy. 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 He says the pain is not too bad until the afternoon when he sits or at night and the sciatica is bad down his left leg and he can't get comfortable. He did feel traction helped last time he was in.  PAIN:  Are you having pain? Yes NPRS scale: 5/10 Pain location: back Pain orientation: Left and Lower; Rt QL PAIN TYPE: radiating to distal thigh, mainly in glute Pain description: burning  Aggravating factors: transitioning from sit to stand, sitting, standing Relieving factors: repositioning    OBJECTIVE:     DIAGNOSTIC FINDINGS:  CT: "bulging disc" L4-23 Mar 2021   PATIENT SURVEYS:  01/11/22 FOTO 56 (predicted 67)        MUSCLE LENGTH: 01/11/22 Hamstrings: tightness noted bil   PALPATION: 01/11/22: mild trigger points noted in bil QL   LUMBARAROM/PROM   A/PROM A/PROM  01/11/2022 AROM 04/09/22  Flexion Limited 50%   Extension WNL   Right lateral flexion WNL   Left lateral flexion WNL (Rt side pain)   Right rotation WNL (Rt side pain)   Left rotation WNL    (Blank rows = not tested)   LE MMT:   MMT Right 01/11/2022 Left 01/11/2022 Left 04/09/22  Hip flexion 5/5 5/5   Hip extension       Hip abduction   4/5   Hip adduction       Hip internal rotation       Hip external rotation       Knee flexion 5/5 4/5   Knee extension 5/5 5/5   Ankle dorsiflexion 5/5 4/5   Ankle plantarflexion       Ankle inversion       Ankle eversion        (Blank rows = not tested)   LUMBAR SPECIAL TESTS:  01/11/22: Slump test: Positive on Lt   GAIT: WNL   TODAY'S TREATMENT  04/09/22 Therapeutic Exercise: Aerobic:  Nu step  L7 X 6 min UE/LE Supine: piriformis stretch 30 sec X 3 bilat.  slump stretch Lt 3 sec 2X10 Bridges 5 sec 2x10 Prone:prone on elbows 2 min then prone press ups holding 5 sec 2X10  Prone bird dog (opposite hip extension with opposite shoulder extension  Modalities: Lumbar mechanical traction 90-75# intermittent X 15 min total  02/12/22 Therapeutic Exercise:  Aerobic: Supine:piriformis stretch 30 sec X 2 bilat. Bridges 5 sec 2x10 Prone:prone on elbows 2 min then prone press ups holding 5 sec 2X10  Seated:slump stretch Lt 3 sec X10  Modalities: Lumbar mechanical traction 80-65# intermittent X 20 min total   PATIENT EDUCATION:  Education details: HEP  Person educated: Patient Education method: Consulting civil engineer, Demonstration, and Handouts Education comprehension: verbalized understanding, returned demonstration, and needs further education     HOME EXERCISE  PROGRAM: Access Code: [email or web address] URL: [email or web address] Date: 02/12/2022 Prepared by: Elsie Ra  Exercises - Left Standing Lateral Shift Correction at Wall - Repetitions  - 3-6 x daily - 7 x weekly - 1 sets - 10 reps - 10 sec hold - Prone on Elbows Stretch  - 3-6 x daily - 7 x weekly - 1 sets - 1 reps - 3 min hold - Prone Press Up  - 2 x daily - 6 x weekly - 2-3 sets - 10 reps - 5 sec hold - Supine Bridge  - 2 x daily - 6 x weekly - 2-3 sets - 10 reps - 5 hold - Standing Lumbar Extension at Wall - Forearms  - 3-6 x daily - 7 x weekly - 1 sets - 10 reps - 10 sec hold - Supine Piriformis Stretch with Foot on Ground  - 2 x daily - 7 x weekly - 1 sets - 3 reps - 30 sec hold - Seated Slump Nerve Glide  - 2 x daily - 6 x weekly - 1-2 sets - 10 reps - 3 hold     ASSESSMENT:   CLINICAL IMPRESSION: Recert performed today as he his previous PT plan of care had expired. He has not yet met his PT goals as he has not been able to attend PT for the last 8 weeks due to work schedule. He continues to have low back pain with left sided radiculopathy and will continue to benefit from skilled PT up to another 8 weeks to address his continued functional impairments and attempt to centralize his radicular symptoms. He may need referral back to MD if he does not improve with another round of PT.    OBJECTIVE IMPAIRMENTS decreased ROM, decreased strength, increased fascial restrictions, increased muscle spasms, and pain.    ACTIVITY LIMITATIONS community activity, driving, occupation, and sleeping .    PERSONAL FACTORS 1 comorbidity: ADHD  are also affecting patient's functional outcome.      REHAB POTENTIAL: Excellent   CLINICAL DECISION MAKING: Evolving/moderate complexity   EVALUATION COMPLEXITY: Moderate     GOALS: Goals reviewed with patient? Yes   SHORT TERM GOALS:   STG Name Target Date Goal status  1 Independent with initial HEP Baseline:  02/01/2022 MET  2 Report  centralization of symptoms for improved function Baseline:  02/01/2022 ongoing    LONG TERM GOALS:    LTG Name Target Date Goal status  1 Independent with final HEP Baseline: 06/04/2022 ongoing  2 FOTO score improved to 67 for improved function Baseline: 06/04/2022 ongoing  3 Lumbar flexion improved to 25% limitation for improved function Baseline: 06/04/2022 ongoing  4 Report pain < 2/10  with sleeping and driving  Baseline: 6/57/9038 ongoing      PLAN: PT FREQUENCY: 1-2 x/week   PT DURATION: 6-8 weeks   PLANNED INTERVENTIONS: Therapeutic exercises, Therapeutic activity, Neuro Muscular re-education, Patient/Family education, Joint mobilization, Dry Needling, Electrical stimulation, Spinal mobilization, Cryotherapy, Moist heat, Taping, Traction, and Manual therapy   PLAN FOR NEXT SESSION:  progressive core/hip/back strengthening continue extension, continue DN/manual and or traction PRN      Elsie Ra, PT, DPT 04/09/22 8:54 AM

## 2022-04-25 ENCOUNTER — Encounter: Payer: 59 | Admitting: Physical Therapy

## 2022-04-25 ENCOUNTER — Telehealth: Payer: Self-pay | Admitting: Physical Therapy

## 2022-04-25 NOTE — Telephone Encounter (Signed)
Pt did not show for PT appointment today. They were contacted and informed of this via voicemail. They were provided the date and time of their next appointment on voicemail. They were instructed to call us to let us know if they cannot make their appointment.  Elsie Ra, PT, DPT 04/25/22 9:42 AM

## 2022-05-02 DIAGNOSIS — Z0000 Encounter for general adult medical examination without abnormal findings: Secondary | ICD-10-CM | POA: Diagnosis not present

## 2022-05-02 DIAGNOSIS — Z125 Encounter for screening for malignant neoplasm of prostate: Secondary | ICD-10-CM | POA: Diagnosis not present

## 2022-05-02 DIAGNOSIS — E162 Hypoglycemia, unspecified: Secondary | ICD-10-CM | POA: Diagnosis not present

## 2022-05-08 ENCOUNTER — Encounter: Payer: Self-pay | Admitting: Physical Therapy

## 2022-05-08 ENCOUNTER — Ambulatory Visit: Payer: 59 | Admitting: Physical Therapy

## 2022-05-08 DIAGNOSIS — M6281 Muscle weakness (generalized): Secondary | ICD-10-CM | POA: Diagnosis not present

## 2022-05-08 DIAGNOSIS — M5442 Lumbago with sciatica, left side: Secondary | ICD-10-CM

## 2022-05-08 DIAGNOSIS — R29898 Other symptoms and signs involving the musculoskeletal system: Secondary | ICD-10-CM | POA: Diagnosis not present

## 2022-05-08 DIAGNOSIS — M545 Low back pain, unspecified: Secondary | ICD-10-CM | POA: Diagnosis not present

## 2022-05-08 DIAGNOSIS — G8929 Other chronic pain: Secondary | ICD-10-CM | POA: Diagnosis not present

## 2022-05-08 DIAGNOSIS — Z1339 Encounter for screening examination for other mental health and behavioral disorders: Secondary | ICD-10-CM | POA: Diagnosis not present

## 2022-05-08 DIAGNOSIS — Z1331 Encounter for screening for depression: Secondary | ICD-10-CM | POA: Diagnosis not present

## 2022-05-08 DIAGNOSIS — Z0000 Encounter for general adult medical examination without abnormal findings: Secondary | ICD-10-CM | POA: Diagnosis not present

## 2022-05-08 NOTE — Therapy (Signed)
OUTPATIENT PHYSICAL THERAPY TREATMENT NOTE   Patient Name: Gabriel Brooks MRN: [id number] DOB:11-Oct-1976, 46 y.o., male Today's Date: 05/08/2022  PCP: Michael Boston, MD REFERRING PROVIDER: Lanae Crumbly, PA-C   PT End of Session - 05/08/22 0801     Visit Number 7    Number of Visits 20    Date for PT Re-Evaluation 06/04/22    Authorization Type UMR    PT Start Time 0800    PT Stop Time 0840    PT Time Calculation (min) 40 min    Activity Tolerance Patient tolerated treatment well    Behavior During Therapy [facility] for tasks assessed/performed               Past Medical History:  Diagnosis Date   Viral myocarditis    Past Surgical History:  Procedure Laterality Date   FEMUR SURGERY     There are no problems to display for this patient.   REFERRING DIAG: M54.42 (ICD-10-CM) - Acute left-sided low back pain with left-sided sciatica Eval and treat as needed. Left hip greater trochanteric bursitis, and lumbar radiculopathy. S/p ORIF left femur with IM nail 2007   THERAPY DIAG:  Chronic bilateral low back pain with left-sided sciatica  Muscle weakness (generalized)  Other symptoms and signs involving the musculoskeletal system  PERTINENT HISTORY: ADHD  PRECAUTIONS: None  SUBJECTIVE: he says pain is intermittent, he has good days and bad days, pain can still radiate down his left leg  PAIN:  Are you having pain? Yes NPRS scale: 4/10 Pain location: back Pain orientation: Left and Lower; Rt QL PAIN TYPE: radiating to distal thigh, mainly in glute Pain description: burning  Aggravating factors: transitioning from sit to stand, sitting, standing Relieving factors: repositioning    OBJECTIVE:    DIAGNOSTIC FINDINGS:  CT: "bulging disc" L4-23 Mar 2021   PATIENT SURVEYS:  01/11/22 FOTO 56 (predicted 67)        MUSCLE LENGTH: 01/11/22 Hamstrings: tightness noted bil   PALPATION: 01/11/22: mild trigger points noted in bil QL   LUMBARAROM/PROM   A/PROM A/PROM   01/11/2022 AROM 05/08/22  Flexion Limited 50% Limited 40%  Extension WNL   Right lateral flexion WNL   Left lateral flexion WNL (Rt side pain)   Right rotation WNL (Rt side pain)   Left rotation WNL    (Blank rows = not tested)   LE MMT:   MMT Right 01/11/2022 Left 01/11/2022 Left 04/09/22  Hip flexion 5/5 5/5   Hip extension       Hip abduction   4/5   Hip adduction       Hip internal rotation       Hip external rotation       Knee flexion 5/5 4/5   Knee extension 5/5 5/5   Ankle dorsiflexion 5/5 4/5   Ankle plantarflexion       Ankle inversion       Ankle eversion        (Blank rows = not tested)   LUMBAR SPECIAL TESTS:  01/11/22: Slump test: Positive on Lt   GAIT: WNL   TODAY'S TREATMENT  05/08/22 Therapeutic Exercise: Standing lumbar extensions mobilization with movement with strap 5 sec X 15    Manual therapy: Lumbar rotational manipulation and grade 5 mobilizations Modalities: Lumbar mechanical traction 100-85# intermittent X 20 min total  04/09/22 Therapeutic Exercise: Aerobic:  Nu step L7 X 6 min UE/LE Supine: piriformis stretch 30 sec X 3 bilat.  slump stretch Lt  3 sec 2X10 Bridges 5 sec 2x10 Prone:prone on elbows 2 min then prone press ups holding 5 sec 2X10  Prone bird dog (opposite hip extension with opposite shoulder extension  Modalities: Lumbar mechanical traction 90-75# intermittent X 15 min total   PATIENT EDUCATION:  Education details: HEP  Person educated: Patient Education method: Consulting civil engineer, Demonstration, and Handouts Education comprehension: verbalized understanding, returned demonstration, and needs further education     HOME EXERCISE PROGRAM: Access Code: [email or web address] URL: [email or web address] Date: 02/12/2022 Prepared by: Elsie Ra  Exercises - Left Standing Lateral Shift Correction at Wall - Repetitions  - 3-6 x daily - 7 x weekly - 1 sets - 10 reps - 10 sec hold - Prone on Elbows Stretch  - 3-6 x daily - 7  x weekly - 1 sets - 1 reps - 3 min hold - Prone Press Up  - 2 x daily - 6 x weekly - 2-3 sets - 10 reps - 5 sec hold - Supine Bridge  - 2 x daily - 6 x weekly - 2-3 sets - 10 reps - 5 hold - Standing Lumbar Extension at Wall - Forearms  - 3-6 x daily - 7 x weekly - 1 sets - 10 reps - 10 sec hold - Supine Piriformis Stretch with Foot on Ground  - 2 x daily - 7 x weekly - 1 sets - 3 reps - 30 sec hold - Seated Slump Nerve Glide  - 2 x daily - 6 x weekly - 1-2 sets - 10 reps - 3 hold     ASSESSMENT:   CLINICAL IMPRESSION: He does feel the traction helps so this was continued today with a little more pull. I did try manipulation with this but only able to get one small cavitation so performed some mobilizations in this position. Cavitation may be more successful before traction so we can try that next time. I then added mobilization with movement using strap to his low back with his repeated lumbar extensions and encouraged him to try to get this in 5 times throughout the day to see if this reduces disc bulge and overall pain. Lumbar flexion ROM has improved some but still with deficits.     OBJECTIVE IMPAIRMENTS decreased ROM, decreased strength, increased fascial restrictions, increased muscle spasms, and pain.    ACTIVITY LIMITATIONS community activity, driving, occupation, and sleeping .    PERSONAL FACTORS 1 comorbidity: ADHD  are also affecting patient's functional outcome.      REHAB POTENTIAL: Excellent   CLINICAL DECISION MAKING: Evolving/moderate complexity   EVALUATION COMPLEXITY: Moderate     GOALS: Goals reviewed with patient? Yes   SHORT TERM GOALS:   STG Name Target Date Goal status  1 Independent with initial HEP Baseline:  02/01/2022 MET  2 Report centralization of symptoms for improved function Baseline:  02/01/2022 Ongoing Still has this intermittently 05/08/22    LONG TERM GOALS:    LTG Name Target Date Goal status  1 Independent with final HEP Baseline:  06/04/2022 ongoing  2 FOTO score improved to 67 for improved function Baseline: 06/04/2022 ongoing  3 Lumbar flexion improved to 25% limitation for improved function Baseline: 06/04/2022 Ongoing Has about 40 %limitation 6/20  4 Report pain < 2/10 with sleeping and driving  Baseline: 0/34/9179 ongoing      PLAN: PT FREQUENCY: 1-2 x/week   PT DURATION: 6-8 weeks   PLANNED INTERVENTIONS: Therapeutic exercises, Therapeutic activity, Neuro Muscular re-education, Patient/Family education, Joint mobilization, Dry Needling, Electrical stimulation, Spinal mobilization,  Cryotherapy, Moist heat, Taping, Traction, and Manual therapy   PLAN FOR NEXT SESSION:  progressive core/hip/back strengthening continue extension, continue DN/manual/manipulation and or traction PRN      Elsie Ra, PT, DPT 05/08/22 9:53 AM

## 2022-05-15 ENCOUNTER — Ambulatory Visit (INDEPENDENT_AMBULATORY_CARE_PROVIDER_SITE_OTHER): Payer: 59 | Admitting: Physical Therapy

## 2022-05-15 ENCOUNTER — Encounter: Payer: Self-pay | Admitting: Physical Therapy

## 2022-05-15 DIAGNOSIS — M5442 Lumbago with sciatica, left side: Secondary | ICD-10-CM

## 2022-05-15 DIAGNOSIS — M6281 Muscle weakness (generalized): Secondary | ICD-10-CM | POA: Diagnosis not present

## 2022-05-15 DIAGNOSIS — G8929 Other chronic pain: Secondary | ICD-10-CM | POA: Diagnosis not present

## 2022-05-15 DIAGNOSIS — R29898 Other symptoms and signs involving the musculoskeletal system: Secondary | ICD-10-CM | POA: Diagnosis not present

## 2022-05-21 ENCOUNTER — Encounter: Payer: 59 | Admitting: Physical Therapy

## 2022-05-29 ENCOUNTER — Encounter: Payer: Self-pay | Admitting: Physical Therapy

## 2022-05-29 ENCOUNTER — Ambulatory Visit (INDEPENDENT_AMBULATORY_CARE_PROVIDER_SITE_OTHER): Payer: 59 | Admitting: Physical Therapy

## 2022-05-29 DIAGNOSIS — G8929 Other chronic pain: Secondary | ICD-10-CM

## 2022-05-29 DIAGNOSIS — R29898 Other symptoms and signs involving the musculoskeletal system: Secondary | ICD-10-CM | POA: Diagnosis not present

## 2022-05-29 DIAGNOSIS — M6281 Muscle weakness (generalized): Secondary | ICD-10-CM | POA: Diagnosis not present

## 2022-05-29 DIAGNOSIS — M5442 Lumbago with sciatica, left side: Secondary | ICD-10-CM | POA: Diagnosis not present

## 2022-05-29 NOTE — Therapy (Addendum)
OUTPATIENT PHYSICAL THERAPY TREATMENT NOTE   Patient Name: Gabriel Brooks MRN: [id number] DOB:08-13-1976, 46 y.o., male Today's Date: 05/29/2022  PCP: Michael Boston, MD REFERRING PROVIDER: No ref. provider found   PT End of Session - 05/29/22 0824     Visit Number 9    Number of Visits 20    Date for PT Re-Evaluation 06/04/22    Authorization Type U[employer]    PT Start Time 0800    PT Stop Time 0845    PT Time Calculation (min) 45 min    Activity Tolerance Patient tolerated treatment well    Behavior During Therapy WFL for tasks assessed/performed                Past Medical History:  Diagnosis Date   Viral myocarditis    Past Surgical History:  Procedure Laterality Date   FEMUR SURGERY     There are no problems to display for this patient.   REFERRING DIAG: M54.42 (ICD-10-CM) - Acute left-sided low back pain with left-sided sciatica Eval and treat as needed. Left hip greater trochanteric bursitis, and lumbar radiculopathy. 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 Yes NPRS scale: 3-4/10 Pain location: back Pain orientation: Left and Lower; Rt QL PAIN TYPE: radiating to distal thigh, mainly in glute Pain description: burning  Aggravating factors: transitioning from sit to stand, sitting, standing Relieving factors: repositioning    OBJECTIVE:    DIAGNOSTIC FINDINGS:  CT: "bulging disc" L4-23 Mar 2021   PATIENT SURVEYS:  01/11/22 FOTO 56 (predicted 67) 05/15/22 FOTO improved to 70% and he met goal for this        MUSCLE LENGTH: 01/11/22 Hamstrings: tightness noted  bil   PALPATION: 01/11/22: mild trigger points noted in bil QL   LUMBARAROM/PROM   A/PROM A/PROM  01/11/2022 AROM 05/08/22  Flexion Limited 50% Limited 40%  Extension WNL   Right lateral flexion WNL   Left lateral flexion WNL (Rt side pain)   Right rotation WNL (Rt side pain)   Left rotation WNL    (Blank rows = not tested)   LE MMT:   MMT Right 01/11/2022 Left 01/11/2022 Left 04/09/22  Hip flexion 5/5 5/5   Hip extension       Hip abduction   4/5   Hip adduction       Hip internal rotation       Hip external rotation       Knee flexion 5/5 4/5   Knee extension 5/5 5/5   Ankle dorsiflexion 5/5 4/5   Ankle plantarflexion       Ankle inversion       Ankle eversion        (Blank rows = not tested)   LUMBAR SPECIAL TESTS:  01/11/22: Slump test: Positive on Lt   GAIT: WNL   TODAY'S TREATMENT  05/29/22 Threrex prone press ups holding 5 sec X10 Prone bird dog (opposite hip extension with opposite shoulder extension 5 sec X10 Quadriped bird dog 5 sec X10  Standing lumbar extensions with strap 5 sec X 10  Seated slump stretch on left X 10  Modalities: Lumbar mechanical traction 85-70# intermittent X 15 min total  05/15/22 Threrex  Standing lumbar extensions with strap 5 sec X 10  Seated slump stretch on left X 10  Supine reverse crunch with contralateral isometric hip hold at 90 deg X 10 bilat Modalities: Lumbar mechanical traction 85-70# intermittent X 15 min total  05/08/22 Therapeutic Exercise: Standing lumbar extensions mobilization with movement with strap 5 sec X 15    Manual therapy: Lumbar rotational manipulation and grade 5 mobilizations Modalities: Lumbar mechanical traction 100-85# intermittent X 20 min total   PATIENT EDUCATION:  Education details: HEP  Person educated: Patient Education method: Consulting civil engineer, Media planner, and Handouts Education comprehension: verbalized understanding, returned demonstration, and needs further education     HOME  EXERCISE PROGRAM: Access Code: [email or web address] URL: [email or web address] Date: 05/15/2022 Prepared by: Elsie Ra  Exercises - Standing Lumbar Extension  - 5 x daily - 7 x weekly - 1-2 sets - 10 reps - 5 hold - Seated Slump Nerve Glide  - 5 x daily - 7 x weekly - 1-2 sets - 10 reps - 3 hold - Prone Press Up  - 1 x daily - 3 x weekly - 2-3 sets - 10 reps - 5 sec hold - Supine Bridge  - 1 x daily - 3 x weekly - 2-3 sets - 10 reps - 5 hold - Supine 90/90 Alternating Toe Touch  - 1 x daily - 3 x weekly - 3 sets - 10 reps - Bird Dog  - 1 x daily - 3 x weekly - 1 sets - 10 reps - 5 sec hold     ASSESSMENT:   CLINICAL IMPRESSION: He is showing improvements overall and is responding well to mechanical traction and extension based exercises which was continued today. He will follow up again in 3 weeks.     OBJECTIVE IMPAIRMENTS decreased ROM, decreased strength, increased fascial restrictions, increased muscle spasms, and pain.    ACTIVITY LIMITATIONS community activity, driving, occupation, and sleeping .    PERSONAL FACTORS 1 comorbidity: ADHD  are also affecting patient's functional outcome.      REHAB POTENTIAL: Excellent   CLINICAL DECISION MAKING: Evolving/moderate complexity   EVALUATION COMPLEXITY: Moderate     GOALS: Goals reviewed with patient? Yes   SHORT TERM GOALS:   STG Name Target Date Goal status  1 Independent with initial HEP Baseline:  02/01/2022 MET  2 Report centralization of symptoms for improved function Baseline:  02/01/2022 Ongoing Still has this intermittently 05/08/22    LONG TERM GOALS:    LTG Name Target Date Goal status  1 Independent with final HEP Baseline: 06/04/2022 Ongoing, updated on 6/27  2 FOTO score improved to 67 for improved function Baseline: 06/04/2022 MET 05/15/22  3 Lumbar flexion improved to 25% limitation for improved function Baseline: 06/04/2022 Ongoing Has about 40 %limitation 6/20  4 Report pain < 2/10 with  sleeping and driving  Baseline: 3/47/4259 ongoing      PLAN: PT FREQUENCY: 1-2 x/week   PT DURATION: 6-8 weeks   PLANNED INTERVENTIONS: Therapeutic exercises, Therapeutic activity, Neuro Muscular re-education, Patient/Family education, Joint mobilization, Dry Needling, Electrical stimulation, Spinal mobilization, Cryotherapy, Moist heat, Taping, Traction, and Manual therapy   PLAN FOR NEXT SESSION:  updated measurements  progressive core/hip/back strengthening continue extension, continue  traction PRN      Elsie Ra, PT, DPT 05/29/22 8:25 AM

## 2022-06-05 ENCOUNTER — Encounter: Payer: 59 | Admitting: Physical Therapy

## 2022-06-19 ENCOUNTER — Encounter: Payer: 59 | Admitting: Physical Therapy

## 2022-06-26 ENCOUNTER — Encounter: Payer: 59 | Admitting: Physical Therapy

## 2022-07-08 ENCOUNTER — Other Ambulatory Visit: Payer: Self-pay | Admitting: Adult Health

## 2022-07-08 DIAGNOSIS — F909 Attention-deficit hyperactivity disorder, unspecified type: Secondary | ICD-10-CM

## 2022-07-09 ENCOUNTER — Other Ambulatory Visit (HOSPITAL_COMMUNITY): Payer: Self-pay

## 2022-07-09 MED ORDER — AMPHETAMINE-DEXTROAMPHETAMINE 5 MG PO TABS
5.0000 mg | ORAL_TABLET | Freq: Every day | ORAL | 0 refills | Status: DC
  Filled 2022-07-09: qty 90, 90d supply, fill #0

## 2022-08-02 ENCOUNTER — Encounter: Payer: Self-pay | Admitting: Surgery

## 2022-08-02 ENCOUNTER — Ambulatory Visit: Payer: 59 | Admitting: Surgery

## 2022-08-02 DIAGNOSIS — M5416 Radiculopathy, lumbar region: Secondary | ICD-10-CM

## 2022-08-02 MED ORDER — METHYLPREDNISOLONE ACETATE 80 MG/ML IJ SUSP: 80.0000 mg | Freq: Once | INTRAMUSCULAR | Status: DC

## 2022-08-02 MED ORDER — KETOROLAC TROMETHAMINE 30 MG/ML IJ SOLN: 30.0000 mg | Freq: Once | INTRAMUSCULAR | Status: AC

## 2022-08-02 NOTE — Progress Notes (Signed)
Office Visit Note   Patient: Gabriel Brooks           Date of Birth: 1975-11-20           MRN: [id number] Visit Date: 08/02/2022              Requested by: Michael Boston, MD [address] [place],  [place] [address] PCP: Michael Boston, MD   Assessment & Plan: Visit Diagnoses:  1. Radiculopathy, lumbar region   Chronic low back pain and left lower extremity radiculopathy.  Failed conservative treatment.   Plan: Since patient has failed conservative treatment with activity modification, formal PT and medication management I will schedule lumbar MRI to rule out HNP/stenosis.  Follow-up with Dr. Lorin Mercy in 3 weeks for recheck to discuss results and further treatment options.  If he gets his scan fairly quickly he will call and let me know and I will get him into see Dr. Lorin Mercy sooner.  In hopes of giving him some improvement of his symptoms he was given a Depo Medrol 80 mg and Toradol 30 mg IM injections today.  Follow-Up Instructions: Return in about 3 weeks (around 08/23/2022) for WITH DR YATES TO REVIEW LUMBAR MRI .   Orders:  Orders Placed This Encounter  Procedures   MR Lumbar Spine w/o contrast   Meds ordered this encounter  Medications   methylPREDNISolone acetate (DEPO-MEDROL) injection 80 mg   ketorolac (TORADOL) 30 MG/ML injection 30 mg      Procedures: No procedures performed   Clinical Data: No additional findings.   Subjective: Chief Complaint  Patient presents with   Lower Back - Follow-up, Pain    HPI 46 year old male comes in today with chronic and worsening low back pain and left lower extremity radiculopathy.  Patient was last seen by me in January 2023 and the was given a prednisone taper and ordered formal PT.  He did go to PT visits.  He has not had any improvement.  States that symptoms worse the last several weeks.  Pain radiating down the left leg.  Aggravated with bending, twisting, lifting. Review of Systems No current complaints of cardiopulmonary  GI/GU issues  Objective: Vital Signs: There were no vitals taken for this visit.  Physical Exam HENT:     Head: Normocephalic.     Nose: Nose normal.  Pulmonary:     Effort: No respiratory distress.  Musculoskeletal:     Comments: Pleasant male alert and oriented in no acute distress.  Ambulates without difficulty.  Pain with lumbar extension.  Lumbar flexion hands to thighs with pain.  Negative logroll bilateral hips.  Positive left straight leg raise.  No focal motor deficits.  Neurological:     General: No focal deficit present.     Mental Status: He is alert.     Ortho Exam  Specialty Comments:  No specialty comments available.  Imaging: No results found.   PMFS History: There are no problems to display for this patient.  Past Medical History:  Diagnosis Date   Viral myocarditis     History reviewed. No pertinent family history.  Past Surgical History:  Procedure Laterality Date   FEMUR SURGERY     Social History   Occupational History   Not on file  Tobacco Use   Smoking status: Never   Smokeless tobacco: Never  Substance and Sexual Activity   Alcohol use: Yes   Drug use: No   Sexual activity: Not on file

## 2022-08-02 NOTE — Progress Notes (Signed)
Depo-Medrol 80 mg (1cc) given IM left dorsogluteal region Ketorolac (generic Toradol) 30 mg (1cc) given IM right dorsogluteal region

## 2022-08-08 ENCOUNTER — Ambulatory Visit
Admission: RE | Admit: 2022-08-08 | Discharge: 2022-08-08 | Disposition: A | Discharge status: Home / Self Care | Payer: 59 | Source: Ambulatory Visit | Attending: Surgery | Admitting: Surgery

## 2022-08-08 DIAGNOSIS — M5416 Radiculopathy, lumbar region: Secondary | ICD-10-CM

## 2022-08-08 DIAGNOSIS — M5126 Other intervertebral disc displacement, lumbar region: Secondary | ICD-10-CM | POA: Diagnosis not present

## 2022-08-08 DIAGNOSIS — M47816 Spondylosis without myelopathy or radiculopathy, lumbar region: Secondary | ICD-10-CM | POA: Diagnosis not present

## 2022-08-15 ENCOUNTER — Encounter: Payer: Self-pay | Admitting: Orthopaedic Surgery

## 2022-08-15 ENCOUNTER — Ambulatory Visit: Payer: 59 | Admitting: Orthopaedic Surgery

## 2022-08-15 VITALS — BP 134/81 | Ht 67.0 in | Wt 158.0 lb

## 2022-08-15 DIAGNOSIS — M5126 Other intervertebral disc displacement, lumbar region: Secondary | ICD-10-CM

## 2022-08-15 DIAGNOSIS — M5416 Radiculopathy, lumbar region: Secondary | ICD-10-CM | POA: Diagnosis not present

## 2022-08-15 NOTE — Progress Notes (Signed)
Office Visit Note   Patient: Gabriel Brooks           Date of Birth: 02/04/1976           MRN: [id number] Visit Date: 08/15/2022              Requested by: Michael Boston, MD [address] [place],  [place] [address] PCP: Michael Boston, MD   Assessment & Plan: Visit Diagnoses:  1. Radiculopathy, lumbar region   2. Protrusion of lumbar intervertebral disc            With radiculopathy  Plan: We will set patient up for single epidural injection left L 4-5 for his prominent disc protrusion.  We discussed operative intervention if he does not respond to the epidural.  Follow-Up Instructions: No follow-ups on file.   Orders:  Orders Placed This Encounter  Procedures   Ambulatory referral to Physical Medicine Rehab   No orders of the defined types were placed in this encounter.     Procedures: No procedures performed   Clinical Data: No additional findings.   Subjective: Chief Complaint  Patient presents with   Lower Back - Pain, Follow-up    MRI lumbar review    HPI 46 year old male with back and left leg pain.  He has problems when he stands problems with walking he has trouble sitting in 1 position.  He is here with his wife and is canceled plan family trip to [facility] due to increased symptoms.  He has been through therapy has taken some ibuprofen and also had prednisone Dosepak with some improvement with prednisone.  MRI scan 08/10/2022 showed left subarticular disc protrusion at L4-5 with impingement of the descending left L5 nerve root.  Review of Systems past history of femur fracture treated by Dr. Louanne Skye with solid healing.  He still has his hardware in place.   Objective: Vital Signs: BP 134/81   Ht '5\' 7"'$  (1.702 m)   Wt 158 lb (71.7 kg)   BMI 24.75 kg/m   Physical Exam Constitutional:      Appearance: He is well-developed.  HENT:     Head: Normocephalic and atraumatic.     Right Ear: External ear normal.     Left Ear: External ear normal.  Eyes:      Pupils: Pupils are equal, round, and reactive to light.  Neck:     Thyroid: No thyromegaly.     Trachea: No tracheal deviation.  Cardiovascular:     Rate and Rhythm: Normal rate.  Pulmonary:     Effort: Pulmonary effort is normal.     Breath sounds: No wheezing.  Abdominal:     General: Bowel sounds are normal.     Palpations: Abdomen is soft.  Musculoskeletal:     Cervical back: Neck supple.  Skin:    General: Skin is warm and dry.     Capillary Refill: Capillary refill takes less than 2 seconds.  Neurological:     Mental Status: He is alert and oriented to person, place, and time.  Psychiatric:        Behavior: Behavior normal.        Thought Content: Thought content normal.        Judgment: Judgment normal.     Ortho Exam positive straight leg raising on the left 70 degrees.  Knee and ankle jerk are intact.  Slight anterior tib weakness on the left negative on the right.  No right sciatic notch tenderness.  Minimal  trochanteric bursal tenderness.  Some decreased sensation L5 distribution left.  S1 sensation normal.  Specialty Comments:  No specialty comments available.  Imaging: Narrative & Impression  CLINICAL DATA:  Chronic progressively worsening low back and left leg pain. Occasional left leg numbness and tingling. No prior surgery.   EXAM: MRI LUMBAR SPINE WITHOUT CONTRAST   TECHNIQUE: Multiplanar, multisequence MR imaging of the lumbar spine was performed. No intravenous contrast was administered.   COMPARISON:  Lumbar spine x-rays dated December 06, 2021.   FINDINGS: Segmentation:  Standard.   Alignment:  Unchanged trace anterolisthesis at L4-L5.   Vertebrae:  No fracture, evidence of discitis, or bone lesion.   Conus medullaris and cauda equina: Conus extends to the T12 level. Conus and cauda equina appear normal.   Paraspinal and other soft tissues: Negative.   Disc levels:   T12-L1 to L3-L4:  Negative.   L4-L5: Mild disc bulging with  superimposed left subarticular disc protrusion. Severe left lateral recess stenosis with impingement of the descending left L5 nerve root. Mild-to-moderate bilateral facet arthropathy with degenerative perifacet marrow edema and small effusion on the right. Mild right lateral recess stenosis. No spinal canal or neuroforaminal stenosis.   L5-S1:  Negative.   IMPRESSION: 1. Left subarticular disc protrusion at L4-L5 with impingement of the descending left L5 nerve root. 2. Acute degenerative inflammation of the right L4-L5 facet joint.     Electronically Signed   By: Titus Dubin M.D.   On: 08/10/2022 12:50     PMFS History: Patient Active Problem List   Diagnosis Date Noted   Protrusion of lumbar intervertebral disc 08/15/2022   Past Medical History:  Diagnosis Date   Viral myocarditis     No family history on file.  Past Surgical History:  Procedure Laterality Date   FEMUR SURGERY     Social History   Occupational History   Not on file  Tobacco Use   Smoking status: Never   Smokeless tobacco: Never  Substance and Sexual Activity   Alcohol use: Yes   Drug use: No   Sexual activity: Not on file

## 2022-09-06 ENCOUNTER — Ambulatory Visit: Payer: Self-pay

## 2022-09-06 ENCOUNTER — Ambulatory Visit: Payer: 59 | Admitting: Physical Medicine and Rehabilitation

## 2022-09-06 VITALS — BP 129/85 | HR 63

## 2022-09-06 DIAGNOSIS — M5416 Radiculopathy, lumbar region: Secondary | ICD-10-CM | POA: Diagnosis not present

## 2022-09-06 DIAGNOSIS — M5116 Intervertebral disc disorders with radiculopathy, lumbar region: Secondary | ICD-10-CM

## 2022-09-06 MED ORDER — METHYLPREDNISOLONE ACETATE 80 MG/ML IJ SUSP
80.0000 mg | Freq: Once | INTRAMUSCULAR | Status: AC
  Administered 2022-09-06: 80 mg

## 2022-09-06 NOTE — Progress Notes (Signed)
Numeric Pain Rating Scale and Functional Assessment Average Pain 4   In the last MONTH (on 0-10 scale) has pain interfered with the following?  1. General activity like being  able to carry out your everyday physical activities such as walking, climbing stairs, carrying groceries, or moving a chair?  Rating(5)   +Driver, -BT, -Dye Allergies.  Pain is worse in morning and night and pain can be 7-8. Standing makes pain worse. Ibuprofen for pain PRN

## 2022-09-06 NOTE — Patient Instructions (Signed)

## 2022-09-11 DIAGNOSIS — H52223 Regular astigmatism, bilateral: Secondary | ICD-10-CM | POA: Diagnosis not present

## 2022-09-11 DIAGNOSIS — H5213 Myopia, bilateral: Secondary | ICD-10-CM | POA: Diagnosis not present

## 2022-09-11 DIAGNOSIS — H524 Presbyopia: Secondary | ICD-10-CM | POA: Diagnosis not present

## 2022-09-16 NOTE — Procedures (Signed)
Lumbosacral Transforaminal Epidural Steroid Injection - Sub-Pedicular Approach with Fluoroscopic Guidance  Patient: Gabriel Brooks      Date of Birth: 1976/02/27 MRN: [id number] PCP: Michael Boston, MD      Visit Date: 09/06/2022   Universal Protocol:    Date/Time: 09/06/2022  Consent Given By: the patient  Position: PRONE  Additional Comments: Vital signs were monitored before and after the procedure. Patient was prepped and draped in the usual sterile fashion. The correct patient, procedure, and site was verified.   Injection Procedure Details:   Procedure diagnoses: Radiculopathy, lumbar region [M54.16]    Meds Administered:  Meds ordered this encounter  Medications   methylPREDNISolone acetate (DEPO-MEDROL) injection 80 mg    Laterality: Left  Location/Site: L5  Needle:5.0 in., 22 ga.  Short bevel or Quincke spinal needle  Needle Placement: Transforaminal  Findings:    -Comments: Excellent flow of contrast along the nerve, nerve root and into the epidural space.  Procedure Details: After squaring off the end-plates to get a true AP view, the C-arm was positioned so that an oblique view of the foramen as noted above was visualized. The target area is just inferior to the "nose of the scotty dog" or sub pedicular. The soft tissues overlying this structure were infiltrated with 2-3 ml. of 1% Lidocaine without Epinephrine.  The spinal needle was inserted toward the target using a "trajectory" view along the fluoroscope beam.  Under AP and lateral visualization, the needle was advanced so it did not puncture dura and was located close the 6 O'Clock position of the pedical in AP tracterory. Biplanar projections were used to confirm position. Aspiration was confirmed to be negative for CSF and/or blood. A 1-2 ml. volume of Isovue-250 was injected and flow of contrast was noted at each level. Radiographs were obtained for documentation purposes.   After attaining the desired  flow of contrast documented above, a 0.5 to 1.0 ml test dose of 0.25% Marcaine was injected into each respective transforaminal space.  The patient was observed for 90 seconds post injection.  After no sensory deficits were reported, and normal lower extremity motor function was noted,   the above injectate was administered so that equal amounts of the injectate were placed at each foramen (level) into the transforaminal epidural space.   Additional Comments:  No complications occurred Dressing: 2 x 2 sterile gauze and Band-Aid    Post-procedure details: Patient was observed during the procedure. Post-procedure instructions were reviewed.  Patient left the clinic in stable condition.

## 2022-09-16 NOTE — Progress Notes (Signed)
Gabriel Brooks - 46 y.o. male MRN [id number]  Date of birth: June 07, 1976  Office Visit Note: Visit Date: 09/06/2022 PCP: Michael Boston, MD Referred by: Marybelle Killings, MD  Subjective: Chief Complaint  Patient presents with   Lower Back - Pain   HPI:  Gabriel Brooks is a 46 y.o. male who comes in today at the request of Dr. Rodell Perna for planned Left L5-S1 Lumbar Transforaminal epidural steroid injection with fluoroscopic guidance.  The patient has failed conservative care including home exercise, medications, time and activity modification.  This injection will be diagnostic and hopefully therapeutic.  Please see requesting physician notes for further details and justification. MRI reviewed with images and spine model.  MRI reviewed in the note below.  Consider interlaminar approach.   ROS Otherwise per HPI.  Assessment & Plan: Visit Diagnoses:    ICD-10-CM   1. Radiculopathy, lumbar region  M54.16 XR C-ARM NO REPORT    Epidural Steroid injection    methylPREDNISolone acetate (DEPO-MEDROL) injection 80 mg    2. Radiculopathy due to lumbar intervertebral disc disorder  M51.16       Plan: No additional findings.   Meds & Orders:  Meds ordered this encounter  Medications   methylPREDNISolone acetate (DEPO-MEDROL) injection 80 mg    Orders Placed This Encounter  Procedures   XR C-ARM NO REPORT   Epidural Steroid injection    Follow-up: Return for visit to requesting provider as needed.   Procedures: No procedures performed  Lumbosacral Transforaminal Epidural Steroid Injection - Sub-Pedicular Approach with Fluoroscopic Guidance  Patient: Gabriel Brooks      Date of Birth: [birth year] MRN: [id number] PCP: Michael Boston, MD      Visit Date: 09/06/2022   Universal Protocol:    Date/Time: 09/06/2022  Consent Given By: the patient  Position: PRONE  Additional Comments: Vital signs were monitored before and after the procedure. Patient was prepped and draped in the usual  sterile fashion. The correct patient, procedure, and site was verified.   Injection Procedure Details:   Procedure diagnoses: Radiculopathy, lumbar region [M54.16]    Meds Administered:  Meds ordered this encounter  Medications   methylPREDNISolone acetate (DEPO-MEDROL) injection 80 mg    Laterality: Left  Location/Site: L5  Needle:5.0 in., 22 ga.  Short bevel or Quincke spinal needle  Needle Placement: Transforaminal  Findings:    -Comments: Excellent flow of contrast along the nerve, nerve root and into the epidural space.  Procedure Details: After squaring off the end-plates to get a true AP view, the C-arm was positioned so that an oblique view of the foramen as noted above was visualized. The target area is just inferior to the "nose of the scotty dog" or sub pedicular. The soft tissues overlying this structure were infiltrated with 2-3 ml. of 1% Lidocaine without Epinephrine.  The spinal needle was inserted toward the target using a "trajectory" view along the fluoroscope beam.  Under AP and lateral visualization, the needle was advanced so it did not puncture dura and was located close the 6 O'Clock position of the pedical in AP tracterory. Biplanar projections were used to confirm position. Aspiration was confirmed to be negative for CSF and/or blood. A 1-2 ml. volume of Isovue-250 was injected and flow of contrast was noted at each level. Radiographs were obtained for documentation purposes.   After attaining the desired flow of contrast documented above, a 0.5 to 1.0 ml test dose of 0.25% Marcaine was injected into  each respective transforaminal space.  The patient was observed for 90 seconds post injection.  After no sensory deficits were reported, and normal lower extremity motor function was noted,   the above injectate was administered so that equal amounts of the injectate were placed at each foramen (level) into the transforaminal epidural space.   Additional  Comments:  No complications occurred Dressing: 2 x 2 sterile gauze and Band-Aid    Post-procedure details: Patient was observed during the procedure. Post-procedure instructions were reviewed.  Patient left the clinic in stable condition.    Clinical History: MRI LUMBAR SPINE WITHOUT CONTRAST   TECHNIQUE: Multiplanar, multisequence MR imaging of the lumbar spine was performed. No intravenous contrast was administered.   COMPARISON:  Lumbar spine x-rays dated December 06, 2021.   FINDINGS: Segmentation:  Standard.   Alignment:  Unchanged trace anterolisthesis at L4-L5.   Vertebrae:  No fracture, evidence of discitis, or bone lesion.   Conus medullaris and cauda equina: Conus extends to the T12 level. Conus and cauda equina appear normal.   Paraspinal and other soft tissues: Negative.   Disc levels:   T12-L1 to L3-L4:  Negative.   L4-L5: Mild disc bulging with superimposed left subarticular disc protrusion. Severe left lateral recess stenosis with impingement of the descending left L5 nerve root. Mild-to-moderate bilateral facet arthropathy with degenerative perifacet marrow edema and small effusion on the right. Mild right lateral recess stenosis. No spinal canal or neuroforaminal stenosis.   L5-S1:  Negative.   IMPRESSION: 1. Left subarticular disc protrusion at L4-L5 with impingement of the descending left L5 nerve root. 2. Acute degenerative inflammation of the right L4-L5 facet joint.     Electronically Signed   By: Titus Dubin M.D.   On: 08/10/2022 12:50     Objective:  VS:  HT:    WT:   BMI:     BP:129/85  HR:63bpm  TEMP: ( )  RESP:  Physical Exam Vitals and nursing note reviewed.  Constitutional:      General: He is not in acute distress.    Appearance: Normal appearance. He is not ill-appearing.  HENT:     Head: Normocephalic and atraumatic.     Right Ear: External ear normal.     Left Ear: External ear normal.     Nose: No  congestion.  Eyes:     Extraocular Movements: Extraocular movements intact.  Cardiovascular:     Rate and Rhythm: Normal rate.     Pulses: Normal pulses.  Pulmonary:     Effort: Pulmonary effort is normal. No respiratory distress.  Abdominal:     General: There is no distension.     Palpations: Abdomen is soft.  Musculoskeletal:        General: No tenderness or signs of injury.     Cervical back: Neck supple.     Right lower leg: No edema.     Left lower leg: No edema.     Comments: Patient has good distal strength without clonus.  Skin:    Findings: No erythema or rash.  Neurological:     General: No focal deficit present.     Mental Status: He is alert and oriented to person, place, and time.     Sensory: No sensory deficit.     Motor: No weakness or abnormal muscle tone.     Coordination: Coordination normal.  Psychiatric:        Mood and Affect: Mood normal.        Behavior:  Behavior normal.      Imaging: No results found.

## 2022-10-10 ENCOUNTER — Other Ambulatory Visit (HOSPITAL_COMMUNITY): Payer: Self-pay

## 2022-10-15 ENCOUNTER — Other Ambulatory Visit: Payer: Self-pay | Admitting: Adult Health

## 2022-10-15 DIAGNOSIS — F909 Attention-deficit hyperactivity disorder, unspecified type: Secondary | ICD-10-CM

## 2022-10-15 NOTE — Telephone Encounter (Signed)
Please call patient to schedule an appt.-

## 2022-10-19 NOTE — Telephone Encounter (Signed)
Has appt 12/4

## 2022-10-19 NOTE — Telephone Encounter (Signed)
Filled 8/22

## 2022-10-22 ENCOUNTER — Telehealth (INDEPENDENT_AMBULATORY_CARE_PROVIDER_SITE_OTHER): Payer: 59 | Admitting: Adult Health

## 2022-10-22 ENCOUNTER — Other Ambulatory Visit (HOSPITAL_COMMUNITY): Payer: Self-pay

## 2022-10-22 ENCOUNTER — Encounter: Payer: Self-pay | Admitting: Adult Health

## 2022-10-22 DIAGNOSIS — F909 Attention-deficit hyperactivity disorder, unspecified type: Secondary | ICD-10-CM | POA: Diagnosis not present

## 2022-10-22 MED ORDER — AMPHETAMINE-DEXTROAMPHETAMINE 5 MG PO TABS
5.0000 mg | ORAL_TABLET | Freq: Every day | ORAL | 0 refills | Status: DC
  Filled 2022-10-22: qty 90, 90d supply, fill #0

## 2022-10-22 NOTE — Progress Notes (Signed)
Gabriel Brooks [id number] 03/31/1976 46 y.o.  Virtual Visit via Video Note  I connected with pt @ on 10/22/22 at  9:00 AM EST by a video enabled telemedicine application and verified that I am speaking with the correct person using two identifiers.   I discussed the limitations of evaluation and management by telemedicine and the availability of in person appointments. The patient expressed understanding and agreed to proceed.  I discussed the assessment and treatment plan with the patient. The patient was provided an opportunity to ask questions and all were answered. The patient agreed with the plan and demonstrated an understanding of the instructions.   The patient was advised to call back or seek an in-person evaluation if the symptoms worsen or if the condition fails to improve as anticipated.  I provided 10 minutes of non-face-to-face time during this encounter.  The patient was located at home.  The provider was located at [place].   Aloha Gell, NP   Subjective:   Patient ID:  Gabriel Brooks is a 46 y.o. (DOB [birth year]) male.  Chief Complaint: No chief complaint on file.   HPI Gabriel Brooks presents for follow-up of ADHD.  Describes mood today as "ok". Pleasant. Mood symptoms - denies depression, anxiety, and irritability. Mood is consistent. Stating "everything is going well". Feels like Adderall 39m daily continues to works well. Family doing well. Stable interest and motivation. Taking medications as prescribed.  Energy levels stable. Active, does not have a regular exercise routine - recovering from a back injury. Enjoys some usual interests and activities. Married. Lives with wife and 2 children. Family local. Spending time with family. Appetite adequate. Weight 160 pounds. Sleeps well most nights. Averages 7 hours. Focus and concentration stable - taking Adderall 542mdaily. Completing tasks. Managing aspects of household. Works full-time - saDance movement psychotherapistDenies SI or HI.  Denies AH or VH. Denies self harm. Denies substance use.  Previous medication trials: Denies   Review of Systems:  Review of Systems  Musculoskeletal:  Negative for gait problem.  Neurological:  Negative for tremors.  Psychiatric/Behavioral:         Please refer to HPI    Medications: I have reviewed the patient's current medications.  Current Outpatient Medications  Medication Sig Dispense Refill   amphetamine-dextroamphetamine (ADDERALL) 5 MG tablet Take 1 tablet  by mouth daily. 90 tablet 0   betamethasone dipropionate 0.05 % cream Apply a thin layer on affected areas 2 times a day for 3 weeks then repeat as needed 45 g 0   COVID-19 At Home Antigen Test (CARESTART COVID-19 HOME TEST) KIT Use as directed within package instructions 4 each 0   fluconazole (DIFLUCAN) 200 MG tablet Take 2 tablets bymouth now and repeat in 1 week. 4 tablet 0   sodium fluoride (PREVIDENT 5000 DRY MOUTH) 1.1 % GEL dental gel Brush on teeth 2 times a day for 2 mins each time, spit out as much as possible then DO NOT swish, eat, or drink for 3032m56 g 12   No current facility-administered medications for this visit.    Medication Side Effects: None  Allergies:  Allergies  Allergen Reactions   Penicillins Other (See Comments)    Unknown childhood reaction    Past Medical History:  Diagnosis Date   Viral myocarditis     No family history on file.  Social History   Socioeconomic History   Marital status: Married    Spouse  name: Not on file   Number of children: Not on file   Years of education: Not on file   Highest education level: Not on file  Occupational History   Not on file  Tobacco Use   Smoking status: Never   Smokeless tobacco: Never  Substance and Sexual Activity   Alcohol use: Yes   Drug use: No   Sexual activity: Not on file  Other Topics Concern   Not on file  Social History Narrative   Not on file   Social  Determinants of Health   Financial Resource Strain: Not on file  Food Insecurity: Not on file  Transportation Needs: Not on file  Physical Activity: Not on file  Stress: Not on file  Social Connections: Not on file  Intimate Partner Violence: Not on file    Past Medical History, Surgical history, Social history, and Family history were reviewed and updated as appropriate.   Please see review of systems for further details on the patient's review from today.   Objective:   Physical Exam:  There were no vitals taken for this visit.  Physical Exam Constitutional:      General: He is not in acute distress. Musculoskeletal:        General: No deformity.  Neurological:     Mental Status: He is alert and oriented to person, place, and time.     Coordination: Coordination normal.  Psychiatric:        Attention and Perception: Attention and perception normal. He does not perceive auditory or visual hallucinations.        Mood and Affect: Mood normal. Mood is not anxious or depressed. Affect is not labile, blunt, angry or inappropriate.        Speech: Speech normal.        Behavior: Behavior normal.        Thought Content: Thought content normal. Thought content is not paranoid or delusional. Thought content does not include homicidal or suicidal ideation. Thought content does not include homicidal or suicidal plan.        Cognition and Memory: Cognition and memory normal.        Judgment: Judgment normal.     Comments: Insight intact     Lab Review:     Component Value Date/Time   NA 138 03/25/2021 0855   K 3.9 03/25/2021 0855   CL 105 03/25/2021 0855   CO2 28 03/25/2021 0855   GLUCOSE 110 (H) 03/25/2021 0855   BUN 15 03/25/2021 0855   CREATININE 1.24 03/25/2021 0855   CALCIUM 9.5 03/25/2021 0855   PROT 7.5 03/25/2021 0855   ALBUMIN 4.2 03/25/2021 0855   AST 22 03/25/2021 0855   ALT 26 03/25/2021 0855   ALKPHOS 42 03/25/2021 0855   BILITOT 0.5 03/25/2021 0855    GFRNONAA >60 03/25/2021 0855   GFRAA >60 06/16/2020 1548       Component Value Date/Time   WBC 6.4 03/25/2021 0855   RBC 4.90 03/25/2021 0855   HGB 14.3 03/25/2021 0855   HCT 43.4 03/25/2021 0855   PLT 311 03/25/2021 0855   MCV 88.6 03/25/2021 0855   MCH 29.2 03/25/2021 0855   MCHC 32.9 03/25/2021 0855   RDW 12.9 03/25/2021 0855   LYMPHSABS 1.6 03/25/2021 0855   MONOABS 0.4 03/25/2021 0855   EOSABS 0.1 03/25/2021 0855   BASOSABS 0.0 03/25/2021 0855    No results found for: "POCLITH", "LITHIUM"   No results found for: "PHENYTOIN", "PHENOBARB", "VALPROATE", "CBMZ"   .  res Assessment: Plan:   Plan:  PDMP reviewed  1. Adderall 54m daily - will call in 3 months for next 3 months refill  Monitor BP between visits while taking stimulant medication.   RTC 6 months  Patient advised to contact office with any questions, adverse effects, or acute worsening in signs and symptoms.  Discussed potential benefits, risks, and side effects of stimulants with patient to include increased heart rate, palpitations, insomnia, increased anxiety, increased irritability, or decreased appetite.  Instructed patient to contact office if experiencing any significant tolerability issues.  Diagnoses and all orders for this visit:  Attention deficit hyperactivity disorder (ADHD), unspecified ADHD type     Please see After Visit Summary for patient specific instructions.  No future appointments.  No orders of the defined types were placed in this encounter.     -------------------------------

## 2022-10-24 ENCOUNTER — Encounter: Payer: Self-pay | Admitting: Gastroenterology

## 2022-12-20 ENCOUNTER — Other Ambulatory Visit (HOSPITAL_COMMUNITY): Payer: Self-pay

## 2022-12-20 MED ORDER — SODIUM FLUORIDE 1.1 % DT GEL
DENTAL | 5 refills | Status: AC
  Filled 2022-12-20: qty 56, 30d supply, fill #0

## 2022-12-21 ENCOUNTER — Ambulatory Visit (AMBULATORY_SURGERY_CENTER): Payer: 59

## 2022-12-21 ENCOUNTER — Other Ambulatory Visit (HOSPITAL_COMMUNITY): Payer: Self-pay

## 2022-12-21 VITALS — Ht 67.0 in | Wt 159.0 lb

## 2022-12-21 DIAGNOSIS — Z1211 Encounter for screening for malignant neoplasm of colon: Secondary | ICD-10-CM

## 2022-12-21 MED ORDER — NA SULFATE-K SULFATE-MG SULF 17.5-3.13-1.6 GM/177ML PO SOLN
1.0000 | Freq: Once | ORAL | 0 refills | Status: AC
  Filled 2022-12-21: qty 354, 30d supply, fill #0

## 2022-12-21 NOTE — Progress Notes (Signed)

## 2022-12-28 ENCOUNTER — Other Ambulatory Visit (HOSPITAL_COMMUNITY): Payer: Self-pay

## 2022-12-29 ENCOUNTER — Other Ambulatory Visit (HOSPITAL_COMMUNITY): Payer: Self-pay

## 2022-12-31 ENCOUNTER — Encounter: Payer: Self-pay | Admitting: Gastroenterology

## 2023-01-11 ENCOUNTER — Encounter: Payer: Self-pay | Admitting: Gastroenterology

## 2023-01-11 ENCOUNTER — Ambulatory Visit: Payer: 59 | Admitting: Gastroenterology

## 2023-01-11 VITALS — BP 100/66 | HR 57 | Temp 97.8°F | Resp 14 | Ht 67.0 in | Wt 150.0 lb

## 2023-01-11 DIAGNOSIS — Z1211 Encounter for screening for malignant neoplasm of colon: Secondary | ICD-10-CM

## 2023-01-11 MED ORDER — SODIUM CHLORIDE 0.9 % IV SOLN: 500.0000 mL | Freq: Once | INTRAVENOUS | Status: DC

## 2023-01-11 NOTE — Op Note (Signed)
[facility] Patient Name: Gabriel Brooks Procedure Date: 01/11/2023 11:18 AM MRN: [id number] Endoscopist: Nicki Reaper E. Candis Schatz , MD, [id number] Age: 47 Referring MD:  Date of Birth: 11-04-1976 Gender: Male Account #: [id number] Procedure:                Colonoscopy Indications:              Screening for colorectal malignant neoplasm, This                            is the patient's first colonoscopy Medicines:                Monitored Anesthesia Care Procedure:                Pre-Anesthesia Assessment:                           - Prior to the procedure, a History and Physical                            was performed, and patient medications and                            allergies were reviewed. The patient's tolerance of                            previous anesthesia was also reviewed. The risks                            and benefits of the procedure and the sedation                            options and risks were discussed with the patient.                            All questions were answered, and informed consent                            was obtained. Prior Anticoagulants: The patient has                            taken no anticoagulant or antiplatelet agents. ASA                            Grade Assessment: II - A patient with mild systemic                            disease. After reviewing the risks and benefits,                            the patient was deemed in satisfactory condition to                            undergo the procedure.  After obtaining informed consent, the colonoscope                            was passed under direct vision. Throughout the                            procedure, the patient's blood pressure, pulse, and                            oxygen saturations were monitored continuously. The                            Olympus CF-[id number]) Colonoscope was                            introduced through the  anus and advanced to the the                            cecum, identified by appendiceal orifice and                            ileocecal valve. The colonoscopy was performed                            without difficulty. The patient tolerated the                            procedure well. The quality of the bowel                            preparation was good. The ileocecal valve,                            appendiceal orifice, and rectum were photographed.                            The bowel preparation used was SUPREP via split                            dose instruction. Scope In: 11:24:02 AM Scope Out: 11:39:58 AM Scope Withdrawal Time: 0 hours 11 minutes 34 seconds  Total Procedure Duration: 0 hours 15 minutes 56 seconds  Findings:                 The perianal and digital rectal examinations were                            normal. Pertinent negatives include normal                            sphincter tone and no palpable rectal lesions.                           A few medium-mouthed and small-mouthed diverticula  were found in the sigmoid colon and descending                            colon.                           The exam was otherwise normal throughout the                            examined colon.                           The retroflexed view of the distal rectum and anal                            verge was normal and showed no anal or rectal                            abnormalities. Complications:            No immediate complications. Estimated Blood Loss:     Estimated blood loss: none. Impression:               - Diverticulosis in the sigmoid colon and in the                            descending colon.                           - The distal rectum and anal verge are normal on                            retroflexion view.                           - No specimens collected. Recommendation:           - Patient has a contact number available  for                            emergencies. The signs and symptoms of potential                            delayed complications were discussed with the                            patient. Return to normal activities tomorrow.                            Written discharge instructions were provided to the                            patient.                           - Resume previous diet.                           -  Continue present medications.                           - Repeat colonoscopy in 10 years for screening                            purposes. Magdalynn Davilla E. Candis Schatz, MD 01/11/2023 11:43:16 AM This report has been signed electronically.

## 2023-01-11 NOTE — Patient Instructions (Signed)
Handout on diverticulosis provided.  Repeat colonoscopy in 10 years.  YOU HAD AN ENDOSCOPIC PROCEDURE TODAY AT [facility]:   Refer to the procedure report that was given to you for any specific questions about what was found during the examination.  If the procedure report does not answer your questions, please call your gastroenterologist to clarify.  If you requested that your care partner not be given the details of your procedure findings, then the procedure report has been included in a sealed envelope for you to review at your convenience later.  YOU SHOULD EXPECT: Some feelings of bloating in the abdomen. Passage of more gas than usual.  Walking can help get rid of the air that was put into your GI tract during the procedure and reduce the bloating. If you had a lower endoscopy (such as a colonoscopy or flexible sigmoidoscopy) you may notice spotting of blood in your stool or on the toilet paper. If you underwent a bowel prep for your procedure, you may not have a normal bowel movement for a few days.  Please Note:  You might notice some irritation and congestion in your nose or some drainage.  This is from the oxygen used during your procedure.  There is no need for concern and it should clear up in a day or so.  SYMPTOMS TO REPORT IMMEDIATELY:  Following lower endoscopy (colonoscopy or flexible sigmoidoscopy):  Excessive amounts of blood in the stool  Significant tenderness or worsening of abdominal pains  Swelling of the abdomen that is new, acute  Fever of 100F or higher  For urgent or emergent issues, a gastroenterologist can be reached at any hour by calling [phone]. Do not use MyChart messaging for urgent concerns.    DIET:  We do recommend a small meal at first, but then you may proceed to your regular diet.  Drink plenty of fluids but you should avoid alcoholic beverages for 24 hours.  ACTIVITY:  You should plan to take it easy for the rest of today  and you should NOT DRIVE or use heavy machinery until tomorrow (because of the sedation medicines used during the test).    FOLLOW UP: Our staff will call the number listed on your records the next business day following your procedure.  We will call around 7:15- 8:00 am to check on you and address any questions or concerns that you may have regarding the information given to you following your procedure. If we do not reach you, we will leave a message.     If any biopsies were taken you will be contacted by phone or by letter within the next 1-3 weeks.  Please call us at [phone] if you have not heard about the biopsies in 3 weeks.    SIGNATURES/CONFIDENTIALITY: You and/or your care partner have signed paperwork which will be entered into your electronic medical record.  These signatures attest to the fact that that the information above on your After Visit Summary has been reviewed and is understood.  Full responsibility of the confidentiality of this discharge information lies with you and/or your care-partner.

## 2023-01-11 NOTE — Progress Notes (Signed)
Vitals-DT  Pt's states no medical or surgical changes since previsit or office visit.  

## 2023-01-11 NOTE — Progress Notes (Signed)
[facility] History and Physical   Primary Care Physician:  Michael Boston, MD   Reason for Procedure:   Colon cancer screening  Plan:    Screening colonoscopy     HPI: Gabriel Brooks is a 47 y.o. male undergoing initial average risk screening colonoscopy.  He has no family history of colon cancer and no chronic GI symptoms.    Past Medical History:  Diagnosis Date   Viral myocarditis 1997    Past Surgical History:  Procedure Laterality Date   FEMUR SURGERY  2007    Prior to Admission medications   Medication Sig Start Date End Date Taking? Authorizing Provider  amphetamine-dextroamphetamine (ADDERALL) 5 MG tablet Take 1 tablet  by mouth daily. 10/22/22  Yes Mozingo, Berdie Ogren, NP  sodium fluoride (PREVIDENT 5000 DRY MOUTH) 1.1 % GEL dental gel Brush on teeth 2 times a day for 2 mins each time, spit out as much as possible then DO NOT swish, eat, or drink for 78mn 08/29/21  Yes   sodium fluoride (PREVIDENT 5000 DRY MOUTH) 1.1 % GEL dental gel Brush on teeth 2 times a day for 2 mins each time, expectorate as much as possible then DO NOT swish, eat, or drink for 30 minutes 12/20/22  Yes   betamethasone dipropionate 0.05 % cream Apply a thin layer on affected areas 2 times a day for 3 weeks then repeat as needed Patient not taking: Reported on 12/21/2022 05/26/21     COVID-19 At Home Antigen Test (CARESTART COVID-19 HOME TEST) KIT Use as directed within package instructions 10/20/21   [facility] fluconazole (DIFLUCAN) 200 MG tablet Take 2 tablets bymouth now and repeat in 1 week. Patient not taking: Reported on 12/21/2022 05/26/21       Current Outpatient Medications  Medication Sig Dispense Refill   amphetamine-dextroamphetamine (ADDERALL) 5 MG tablet Take 1 tablet  by mouth daily. 90 tablet 0   sodium fluoride (PREVIDENT 5000 DRY MOUTH) 1.1 % GEL dental gel Brush on teeth 2 times a day for 2 mins each time, spit out as much as possible then DO NOT swish, eat, or  drink for 344m 56 g 12   sodium fluoride (PREVIDENT 5000 DRY MOUTH) 1.1 % GEL dental gel Brush on teeth 2 times a day for 2 mins each time, expectorate as much as possible then DO NOT swish, eat, or drink for 30 minutes 56 g 5   betamethasone dipropionate 0.05 % cream Apply a thin layer on affected areas 2 times a day for 3 weeks then repeat as needed (Patient not taking: Reported on 12/21/2022) 45 g 0   COVID-19 At Home Antigen Test (CARESTART COVID-19 HOME TEST) KIT Use as directed within package instructions 4 each 0   fluconazole (DIFLUCAN) 200 MG tablet Take 2 tablets bymouth now and repeat in 1 week. (Patient not taking: Reported on 12/21/2022) 4 tablet 0   Current Facility-Administered Medications  Medication Dose Route Frequency Provider Last Rate Last Admin   0.9 %  sodium chloride infusion  500 mL Intravenous Once CuDaryel NovemberMD        Allergies as of 01/11/2023 - Review Complete 01/11/2023  Allergen Reaction Noted   Penicillins  11/05/2011    Family History  Problem Relation Age of Onset   Esophageal cancer Father [id number]     Pt dad had a tumor removed from his throat not sure if it was cancer but thinks it might be  Colon cancer Neg Hx    Colon polyps Neg Hx    Prostate cancer Neg Hx    Rectal cancer Neg Hx     Social History   Socioeconomic History   Marital status: Married    Spouse name: Not on file   Number of children: Not on file   Years of education: Not on file   Highest education level: Not on file  Occupational History   Not on file  Tobacco Use   Smoking status: Never   Smokeless tobacco: Never  Vaping Use   Vaping Use: Never used  Substance and Sexual Activity   Alcohol use: Yes   Drug use: No   Sexual activity: Not on file  Other Topics Concern   Not on file  Social History Narrative   Not on file   Social Determinants of Health   Financial Resource Strain: Not on file  Food Insecurity: Not on file  Transportation Needs: Not on  file  Physical Activity: Not on file  Stress: Not on file  Social Connections: Not on file  Intimate Partner Violence: Not on file    Review of Systems:  All other review of systems negative except as mentioned in the HPI.  Physical Exam: Vital signs BP 125/74 (BP Location: Left Arm, Patient Position: Sitting, Cuff Size: Normal)   Pulse 85   Temp 97.8 F (36.6 C) (Temporal)   Ht '5\' 7"'$  (1.702 m)   Wt 150 lb (68 kg)   SpO2 98%   BMI 23.49 kg/m   General:   Alert,  Well-developed, well-nourished, pleasant and cooperative in NAD Airway:  Mallampati 3 Lungs:  Clear throughout to auscultation.   Heart:  Regular rate and rhythm; no murmurs, clicks, rubs,  or gallops. Abdomen:  Soft, nontender and nondistended. Normal bowel sounds.   Neuro/Psych:  Normal mood and affect. A and O x 3   Elroy Schembri E. Candis Schatz, MD [facility]

## 2023-01-11 NOTE — Progress Notes (Signed)
Report to PACU, RN, vss, BBS= Clear.  

## 2023-01-14 ENCOUNTER — Telehealth: Payer: Self-pay | Admitting: *Deleted

## 2023-01-14 NOTE — Telephone Encounter (Signed)
  Follow up Call-     01/11/2023   10:28 AM 01/11/2023   10:18 AM  Call back number  Post procedure Call Back phone  # [phone]   Permission to leave phone message  Yes     Patient questions:  Do you have a fever, pain , or abdominal swelling? No. Pain Score  0 *  Have you tolerated food without any problems? Yes.    Have you been able to return to your normal activities? Yes.    Do you have any questions about your discharge instructions: Diet   No. Medications  No. Follow up visit  No.  Do you have questions or concerns about your Care? No.  Actions: * If pain score is 4 or above: No action needed, pain <4.

## 2023-02-27 ENCOUNTER — Other Ambulatory Visit (HOSPITAL_COMMUNITY): Payer: Self-pay

## 2023-02-27 ENCOUNTER — Other Ambulatory Visit: Payer: Self-pay | Admitting: Adult Health

## 2023-02-27 DIAGNOSIS — F909 Attention-deficit hyperactivity disorder, unspecified type: Secondary | ICD-10-CM

## 2023-02-27 MED ORDER — AMPHETAMINE-DEXTROAMPHETAMINE 5 MG PO TABS
5.0000 mg | ORAL_TABLET | Freq: Every day | ORAL | 0 refills | Status: DC
  Filled 2023-02-27: qty 90, 90d supply, fill #0

## 2023-03-07 ENCOUNTER — Other Ambulatory Visit (HOSPITAL_COMMUNITY): Payer: Self-pay

## 2023-03-18 ENCOUNTER — Other Ambulatory Visit (HOSPITAL_COMMUNITY): Payer: Self-pay

## 2023-03-18 DIAGNOSIS — I788 Other diseases of capillaries: Secondary | ICD-10-CM | POA: Diagnosis not present

## 2023-03-18 DIAGNOSIS — D2262 Melanocytic nevi of left upper limb, including shoulder: Secondary | ICD-10-CM | POA: Diagnosis not present

## 2023-03-18 DIAGNOSIS — L821 Other seborrheic keratosis: Secondary | ICD-10-CM | POA: Diagnosis not present

## 2023-03-18 DIAGNOSIS — B360 Pityriasis versicolor: Secondary | ICD-10-CM | POA: Diagnosis not present

## 2023-03-18 MED ORDER — FLUCONAZOLE 200 MG PO TABS
ORAL_TABLET | ORAL | 1 refills | Status: DC
  Filled 2023-03-18: qty 4, 7d supply, fill #0
  Filled 2023-04-10: qty 4, 7d supply, fill #1

## 2023-03-22 ENCOUNTER — Other Ambulatory Visit (HOSPITAL_COMMUNITY): Payer: Self-pay

## 2023-04-25 ENCOUNTER — Telehealth (INDEPENDENT_AMBULATORY_CARE_PROVIDER_SITE_OTHER): Payer: 59 | Admitting: Adult Health

## 2023-04-25 ENCOUNTER — Other Ambulatory Visit (HOSPITAL_COMMUNITY): Payer: Self-pay

## 2023-04-25 ENCOUNTER — Encounter: Payer: Self-pay | Admitting: Adult Health

## 2023-04-25 DIAGNOSIS — F909 Attention-deficit hyperactivity disorder, unspecified type: Secondary | ICD-10-CM | POA: Diagnosis not present

## 2023-04-25 MED ORDER — AMPHETAMINE-DEXTROAMPHETAMINE 5 MG PO TABS
5.0000 mg | ORAL_TABLET | Freq: Every day | ORAL | 0 refills | Status: DC
  Filled 2023-04-25 – 2023-06-09 (×2): qty 90, 90d supply, fill #0

## 2023-04-25 NOTE — Progress Notes (Signed)
Gabriel Brooks [id number] [birth year] 47 y.o.  Virtual Visit via Video Note  I connected with pt @ on 04/25/23 at  9:40 AM EDT by a video enabled telemedicine application and verified that I am speaking with the correct person using two identifiers.   I discussed the limitations of evaluation and management by telemedicine and the availability of in person appointments. The patient expressed understanding and agreed to proceed.  I discussed the assessment and treatment plan with the patient. The patient was provided an opportunity to ask questions and all were answered. The patient agreed with the plan and demonstrated an understanding of the instructions.   The patient was advised to call back or seek an in-person evaluation if the symptoms worsen or if the condition fails to improve as anticipated.  I provided 10 minutes of non-face-to-face time during this encounter.  The patient was located at home.  The provider was located at [facility].   Dorothyann Gibbs, NP   Subjective:   Patient ID:  Gabriel Brooks is a 46 y.o. (DOB 1975-11-28) male.  Chief Complaint: No chief complaint on file.   HPI NEEMA NAIR presents for follow-up of ADHD.  Describes mood today as "ok". Pleasant. Mood symptoms - denies depression, anxiety, and irritability. Mood is consistent. Stating "I'm doing good". Feels like Adderall continues to work well for him. Family doing well. Stable interest and motivation. Taking medications as prescribed.  Energy levels stable. Active, does not have a regular exercise routine - recovering from a back injury. Enjoys some usual interests and activities. Married. Lives with wife and 2 children. Family local. Spending time with family. Appetite adequate. Weight 160 pounds. Sleeps well most nights. Averages 7 hours. Focus and concentration stable - taking Adderall 5mg  daily. Completing tasks. Managing aspects of household. Works full-time - Electronics engineer. Denies SI or HI.  Denies AH or VH. Denies self harm. Denies substance use.  Previous medication trials: Denies  Review of Systems:  Review of Systems  Musculoskeletal:  Negative for gait problem.  Neurological:  Negative for tremors.  Psychiatric/Behavioral:         Please refer to HPI    Medications: I have reviewed the patient's current medications.  Current Outpatient Medications  Medication Sig Dispense Refill   amphetamine-dextroamphetamine (ADDERALL) 5 MG tablet Take 1 tablet  by mouth daily. 90 tablet 0   betamethasone dipropionate 0.05 % cream Apply a thin layer on affected areas 2 times a day for 3 weeks then repeat as needed (Patient not taking: Reported on 12/21/2022) 45 g 0   COVID-19 At Home Antigen Test (CARESTART COVID-19 HOME TEST) KIT Use as directed within package instructions 4 each 0   fluconazole (DIFLUCAN) 200 MG tablet Take 2 tablets bymouth now and repeat in 1 week. (Patient not taking: Reported on 12/21/2022) 4 tablet 0   fluconazole (DIFLUCAN) 200 MG tablet Take 2 tablets now, repeat in 1 week. 4 tablet 1   sodium fluoride (PREVIDENT 5000 DRY MOUTH) 1.1 % GEL dental gel Brush on teeth 2 times a day for 2 mins each time, spit out as much as possible then DO NOT swish, eat, or drink for 56 g 12   sodium fluoride (PREVIDENT 5000 DRY MOUTH) 1.1 % GEL dental gel Brush on teeth 2 times a day for 2 mins each time, expectorate as much as possible then DO NOT swish, eat, or drink for 30 minutes 56 g 5  No current facility-administered medications for this visit.    Medication Side Effects: None  Allergies:  Allergies  Allergen Reactions   Penicillins     Unknown childhood reaction    Past Medical History:  Diagnosis Date   Viral myocarditis 38    Family History  Problem Relation Age of Onset   Esophageal cancer Father 82 - 40       Pt dad had a tumor removed from his throat not sure if it was cancer but thinks it might be   Colon cancer  Neg Hx    Colon polyps Neg Hx    Prostate cancer Neg Hx    Rectal cancer Neg Hx     Social History   Socioeconomic History   Marital status: Married    Spouse name: Not on file   Number of children: Not on file   Years of education: Not on file   Highest education level: Not on file  Occupational History   Not on file  Tobacco Use   Smoking status: Never   Smokeless tobacco: Never  Vaping Use   Vaping Use: Never used  Substance and Sexual Activity   Alcohol use: Yes   Drug use: No   Sexual activity: Not on file  Other Topics Concern   Not on file  Social History Narrative   Not on file   Social Determinants of Health   Financial Resource Strain: Not on file  Food Insecurity: Not on file  Transportation Needs: Not on file  Physical Activity: Not on file  Stress: Not on file  Social Connections: Not on file  Intimate Partner Violence: Not on file    Past Medical History, Surgical history, Social history, and Family history were reviewed and updated as appropriate.   Please see review of systems for further details on the patient's review from today.   Objective:   Physical Exam:  There were no vitals taken for this visit.  Physical Exam Constitutional:      General: He is not in acute distress. Musculoskeletal:        General: No deformity.  Neurological:     Mental Status: He is alert and oriented to person, place, and time.     Coordination: Coordination normal.  Psychiatric:        Attention and Perception: Attention and perception normal. He does not perceive auditory or visual hallucinations.        Mood and Affect: Mood normal. Mood is not anxious or depressed. Affect is not labile, blunt, angry or inappropriate.        Speech: Speech normal.        Behavior: Behavior normal.        Thought Content: Thought content normal. Thought content is not paranoid or delusional. Thought content does not include homicidal or suicidal ideation. Thought content  does not include homicidal or suicidal plan.        Cognition and Memory: Cognition and memory normal.        Judgment: Judgment normal.     Comments: Insight intact     Lab Review:     Component Value Date/Time   NA 138 03/25/2021 0855   K 3.9 03/25/2021 0855   CL 105 03/25/2021 0855   CO2 28 03/25/2021 0855   GLUCOSE 110 (H) 03/25/2021 0855   BUN 15 03/25/2021 0855   CREATININE 1.24 03/25/2021 0855   CALCIUM 9.5 03/25/2021 0855   PROT 7.5 03/25/2021 0855   ALBUMIN 4.2 03/25/2021  0855   AST 22 03/25/2021 0855   ALT 26 03/25/2021 0855   ALKPHOS 42 03/25/2021 0855   BILITOT 0.5 03/25/2021 0855   GFRNONAA >60 03/25/2021 0855   GFRAA >60 06/16/2020 1548       Component Value Date/Time   WBC 6.4 03/25/2021 0855   RBC 4.90 03/25/2021 0855   HGB 14.3 03/25/2021 0855   HCT 43.4 03/25/2021 0855   PLT 311 03/25/2021 0855   MCV 88.6 03/25/2021 0855   MCH 29.2 03/25/2021 0855   MCHC 32.9 03/25/2021 0855   RDW 12.9 03/25/2021 0855   LYMPHSABS 1.6 03/25/2021 0855   MONOABS 0.4 03/25/2021 0855   EOSABS 0.1 03/25/2021 0855   BASOSABS 0.0 03/25/2021 0855    No results found for: "POCLITH", "LITHIUM"   No results found for: "PHENYTOIN", "PHENOBARB", "VALPROATE", "CBMZ"   .res Assessment: Plan:    Plan:  PDMP reviewed  1. Adderall 5mg  daily - will call in 3 months for next 3 months refill  Monitor BP between visits while taking stimulant medication.   RTC 6 months  Patient advised to contact office with any questions, adverse effects, or acute worsening in signs and symptoms.  Discussed potential benefits, risks, and side effects of stimulants with patient to include increased heart rate, palpitations, insomnia, increased anxiety, increased irritability, or decreased appetite.  Instructed patient to contact office if experiencing any significant tolerability issues.   There are no diagnoses linked to this encounter.   Please see After Visit Summary for patient  specific instructions.  No future appointments.  No orders of the defined types were placed in this encounter.     -------------------------------

## 2023-04-25 NOTE — Addendum Note (Signed)
Addended by: Dorothyann Gibbs on: 04/25/2023 10:03 AM   Modules accepted: Level of Service

## 2023-05-02 IMAGING — CT CT RENAL STONE PROTOCOL
2 of 4 series · 16 of 46 positions shown, 18 images · non-contrast
Comparison: 09/02/2020.

CLINICAL DATA: Patient reports having left sided kidney stone that
comes and goes since May 2020. Has ct scheduled for this week but
pain is unbearable. [DATE]. History of kidney stones.

EXAM:
CT ABDOMEN AND PELVIS WITHOUT CONTRAST
TECHNIQUE: Multidetector CT imaging of the abdomen and pelvis was performed
following the standard protocol without IV contrast.

[Series 3: axial st · axial · 0.72mm/px · z∈[-896,-500]mm · 13 of 89 slices shown, 15 images]
[im 5/89  soft-tissue]
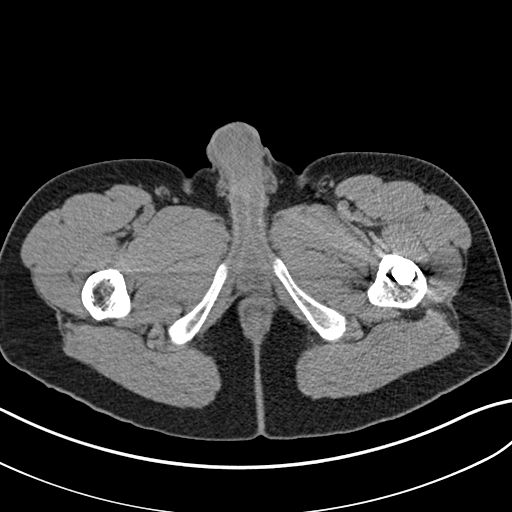
[im 5/89  bone]
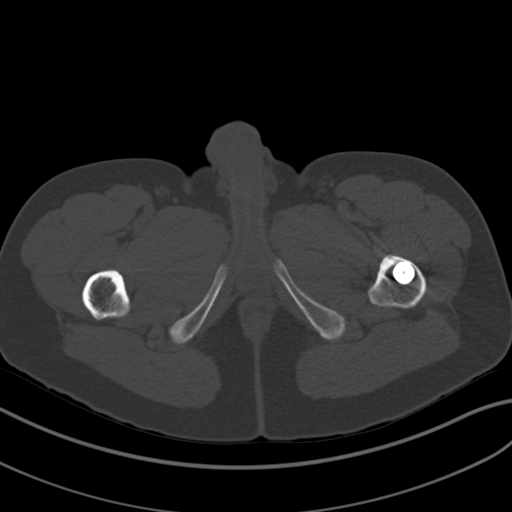
[im 13/89  soft-tissue]
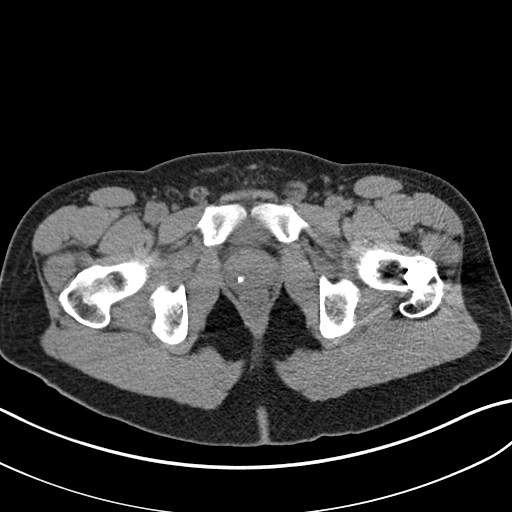
[im 17/89  soft-tissue]
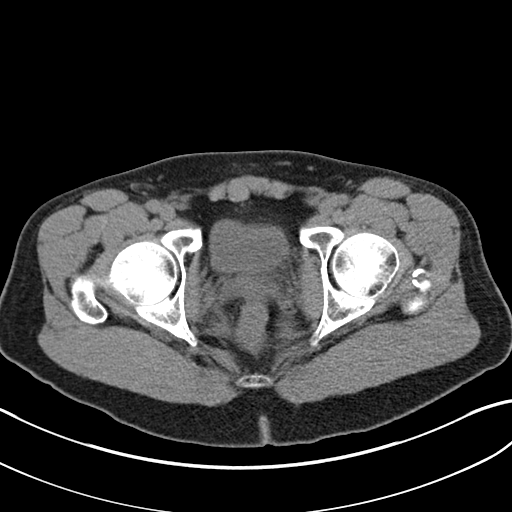
[im 26/89  soft-tissue]
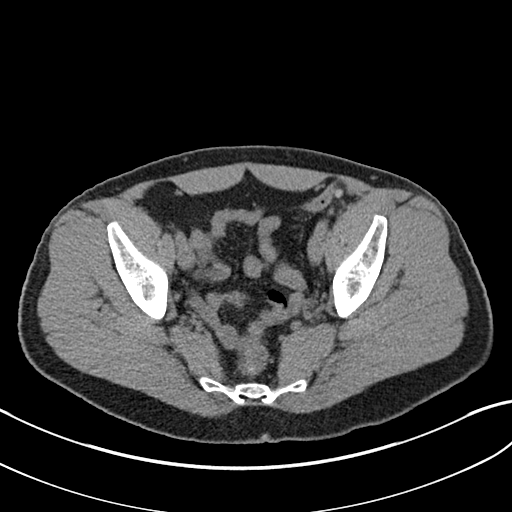
[im 30/89  soft-tissue]
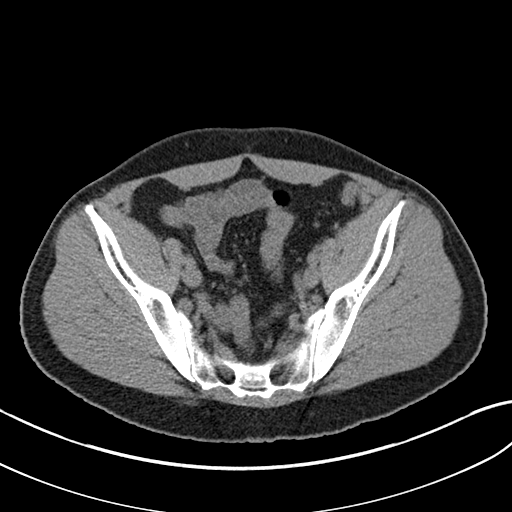
[im 38/89  soft-tissue]
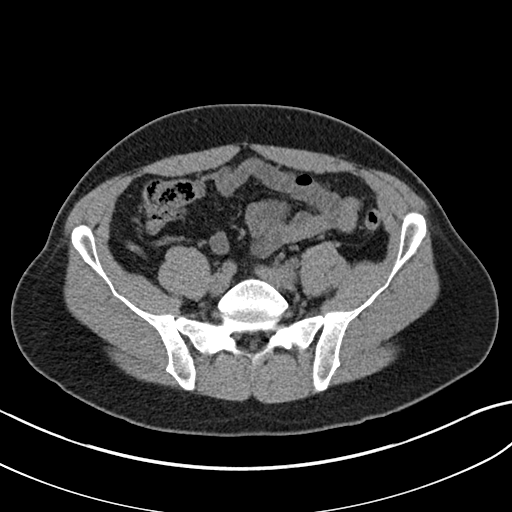
[im 47/89  soft-tissue]
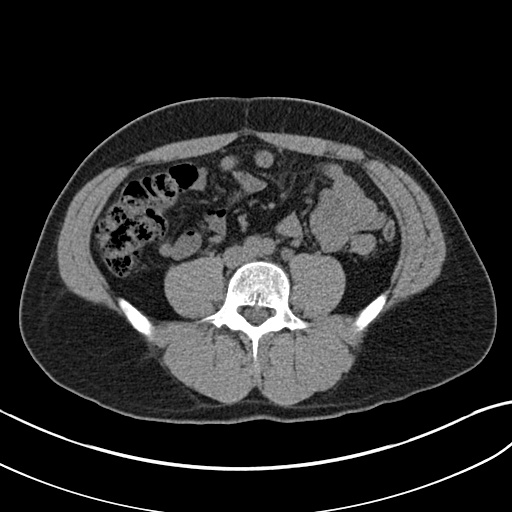
[im 51/89  soft-tissue]
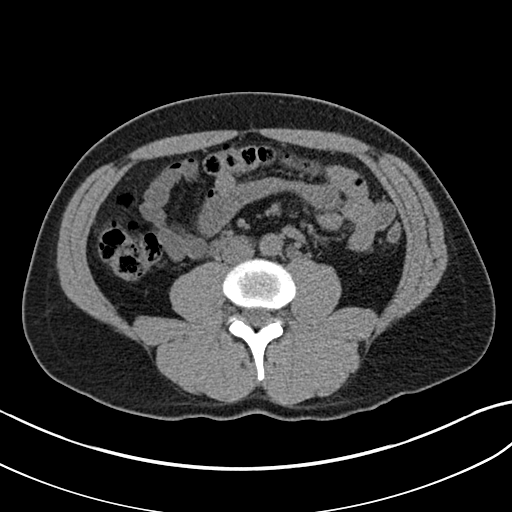
[im 59/89  soft-tissue]
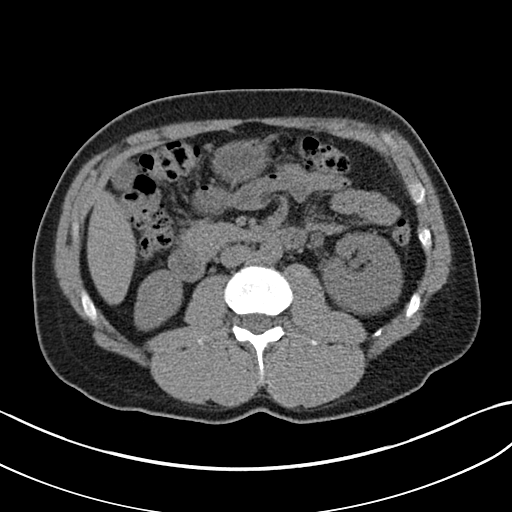
[im 59/89  bone]
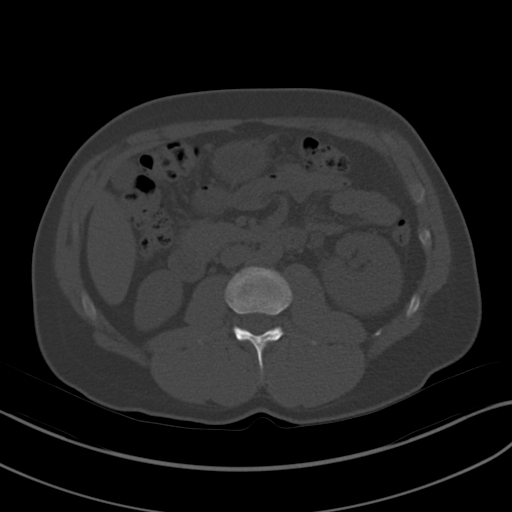
[im 63/89  soft-tissue]
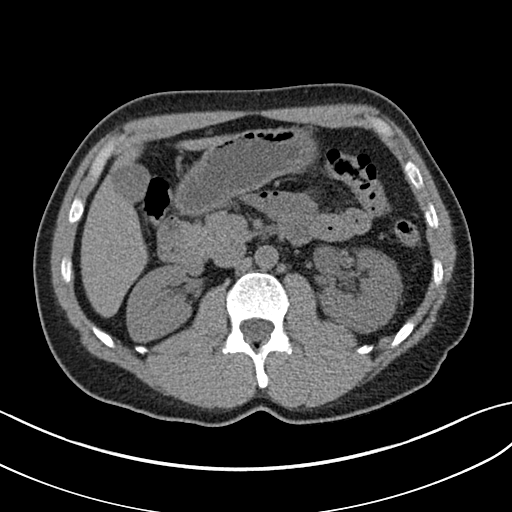
[im 72/89  soft-tissue]
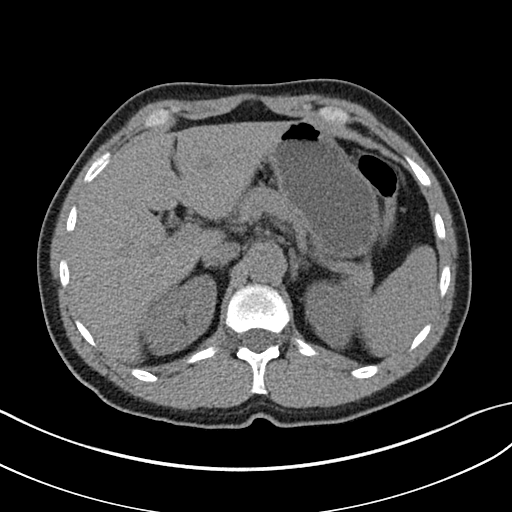
[im 76/89  soft-tissue]
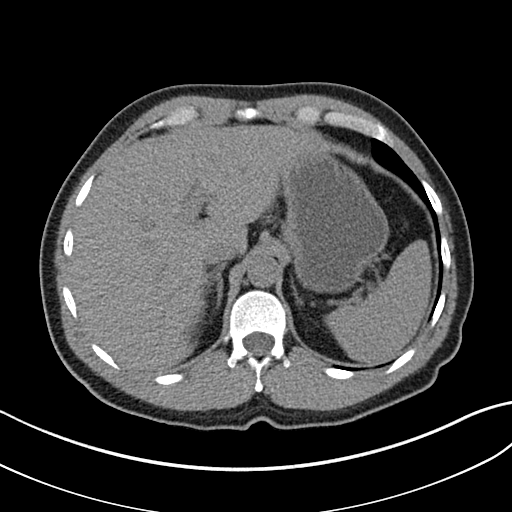
[im 84/89  soft-tissue]
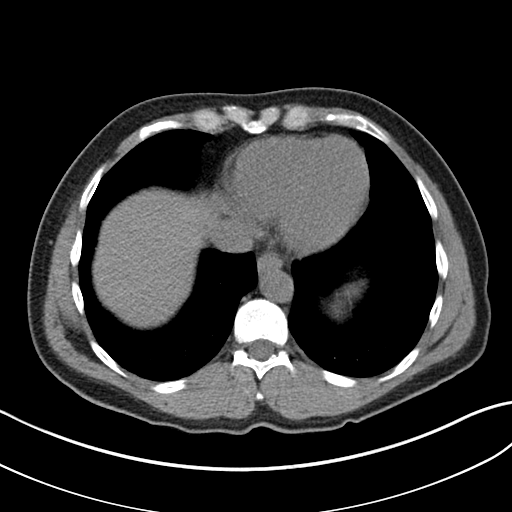

[Series 5: coronal · coronal · 0.81mm/px · 3 of 138 slices shown]
[im 46/138  soft-tissue]
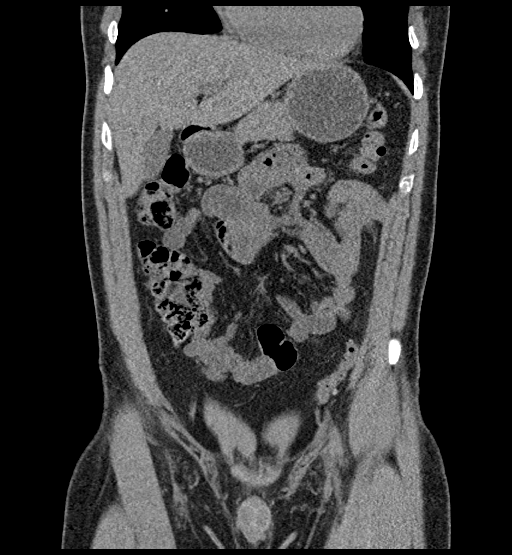
[im 61/138  soft-tissue]
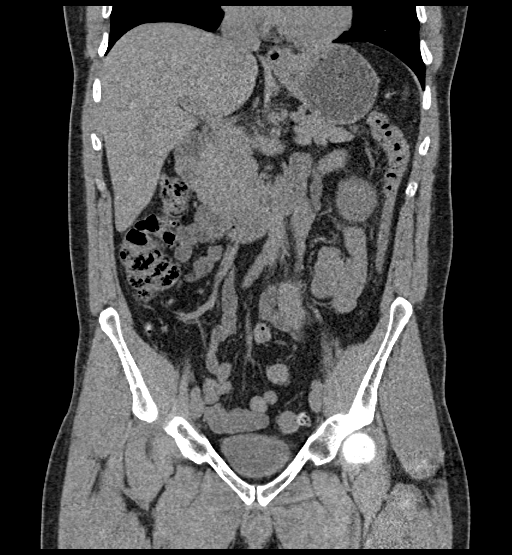
[im 77/138  soft-tissue]
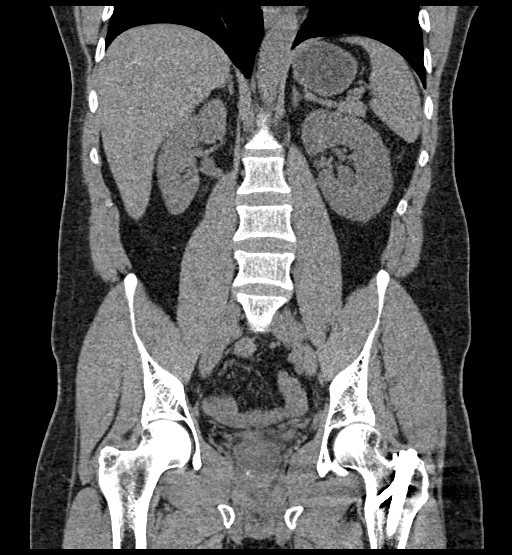

[16 of 46 positions shown; findings below may reference images not displayed]

FINDINGS: Lower chest: Minor stable scarring anterior right middle lobe. Lung
bases otherwise clear.

Hepatobiliary: No focal liver abnormality is seen. No gallstones,
gallbladder wall thickening, or biliary dilatation.

Pancreas: Unremarkable. No pancreatic ductal dilatation or
surrounding inflammatory changes.

Spleen: Normal in size without focal abnormality.

Adrenals/Urinary Tract: No adrenal masses.

Mild left hydroureteronephrosis. This is due to a 3 mm stone at the
left ureterovesicular junction.

Kidneys normal in size, orientation and position. No renal masses. 2
mm stone, upper pole of the left kidney. Tiny stone in the lower
pole of the right kidney. No right hydronephrosis. Normal right
ureter.

Bladder is mostly decompressed, otherwise unremarkable.

Stomach/Bowel: Stomach is within normal limits. Appendix appears
normal. No evidence of bowel wall thickening, distention, or
inflammatory changes.

Vascular/Lymphatic: No significant vascular findings are present. No
enlarged abdominal or pelvic lymph nodes.

Reproductive: Unremarkable.

Other: No abdominal wall hernia or abnormality. No abdominopelvic
ascites.

Musculoskeletal: No fracture or acute finding. No bone lesion. Mild
loss of disc height at L4-L5.
IMPRESSION: 1. 3 mm stone at the left ureterovesicular junction leads to mild
left hydroureteronephrosis.
2. No other acute abnormality within the abdomen or pelvis.
3. Single small nonobstructing stones in each kidney.

## 2023-06-10 ENCOUNTER — Other Ambulatory Visit (HOSPITAL_COMMUNITY): Payer: Self-pay

## 2023-06-10 ENCOUNTER — Other Ambulatory Visit: Payer: Self-pay

## 2023-06-11 ENCOUNTER — Other Ambulatory Visit (HOSPITAL_COMMUNITY): Payer: Self-pay

## 2023-06-26 ENCOUNTER — Other Ambulatory Visit: Payer: 59

## 2023-06-27 ENCOUNTER — Telehealth: Payer: Self-pay

## 2023-06-27 ENCOUNTER — Other Ambulatory Visit: Payer: Self-pay

## 2023-06-27 ENCOUNTER — Other Ambulatory Visit (HOSPITAL_COMMUNITY): Payer: Self-pay

## 2023-06-27 ENCOUNTER — Ambulatory Visit (INDEPENDENT_AMBULATORY_CARE_PROVIDER_SITE_OTHER): Payer: 59 | Admitting: "Endocrinology

## 2023-06-27 ENCOUNTER — Encounter: Payer: Self-pay | Admitting: "Endocrinology

## 2023-06-27 VITALS — BP 115/85 | HR 65 | Ht 67.0 in | Wt 152.2 lb

## 2023-06-27 DIAGNOSIS — E162 Hypoglycemia, unspecified: Secondary | ICD-10-CM

## 2023-06-27 MED ORDER — BLOOD GLUCOSE TEST VI STRP
1.0000 | ORAL_STRIP | Freq: Three times a day (TID) | 3 refills | Status: AC
Start: 2023-06-27 — End: 2023-11-07
  Filled 2023-06-27: qty 100, 34d supply, fill #0

## 2023-06-27 MED ORDER — FREESTYLE LANCETS MISC
1.0000 | Freq: Three times a day (TID) | 3 refills | Status: AC
Start: 2023-06-27 — End: 2023-07-27
  Filled 2023-06-27: qty 100, 33d supply, fill #0

## 2023-06-27 MED ORDER — LANCET DEVICE MISC
1.0000 | Freq: Three times a day (TID) | 0 refills | Status: AC
Start: 2023-06-27 — End: 2023-07-27
  Filled 2023-06-27: qty 1, 30d supply, fill #0

## 2023-06-27 MED ORDER — BLOOD GLUCOSE MONITOR SYSTEM W/DEVICE KIT
1.0000 | PACK | Freq: Three times a day (TID) | 0 refills | Status: AC
Start: 2023-06-27 — End: ?
  Filled 2023-06-27: qty 1, 30d supply, fill #0

## 2023-06-27 MED ORDER — DEXCOM G7 SENSOR MISC
1.0000 | 0 refills | Status: AC
Start: 2023-06-27 — End: ?
  Filled 2023-06-27: qty 9, 90d supply, fill #0

## 2023-06-27 NOTE — Patient Instructions (Addendum)
10-Point Nutrition Plan for Preventing Hypoglycemia Control portions of carbohydrate - 30 grams/meal, 15 grams/snack. Choose low-glycemic carbohydrates. Avoid high-glycemic carbohydrates. Include (heart-healthy) fats in each meal or snack - 15 grams/meal, 5 grams/snack. Emphasize optimal protein intake. Space meals/snacks 3-4 hours apart. Avoid consuming liquids with meals. Avoid alcohol. Avoid caffeine. Maintain post-bariatric vitamin and mineral intake.   Low Glycemic Index Carbohydrates (CHOOSE) Steel-cut oats (regular, not quick-cook or instant) Oat bran cereal Beans/legumes (e.g., garbanzo, navy, kidney, lima, pinto, black-eyed and pea beans, edamame (soybeans), lentils Bean products (e.g., hummus, tofu) Pearled barley, cooked al dente Yams Some fruits (e.g., grapefruit, apples, pears, berries, apricots, peaches) Some pasta (e.g., Barilla Plus pasta), cooked al dente Some whole grain breads (e.g., Ezekiel bread, Joseph's Flax, Oat Bran & Whole Wheat Pita/Lavash/Tortillas) Some whole grain crackers (e.g., RyKrisp, RyVita, Wasa) Brown rice, wild rice Quinoa  Buckwheat (a grass)   High Glycemic Index Carbohydrates (AVOID) Refined breakfast cereals (e.g., Corn Flakes, Rice Krispies, Cream of Rice, instant oatmeal) Regular pasta Most starchy vegetables (e.g., white potatoes, corn, winter (orange) squash) White rice, rice cakes Popcorn, pretzels, chips Some fruits (e.g., ripe bananas, pineapple, mango, watermelon, grapes) All fruit juices and sweetened drinks (e.g., sodas, sweetened iced tea) Bread, rolls, bagels, English muffins, and crackers made with refined flour Sweets (e.g., candy, cake, cookies, ice cream, syrup)   Heart-Healthy Fats (Adapt) Nuts, nut butters Avocado, guacamole Olives Most plant oils (e.g., olive, canola, peanut, soy, sunflower, sesame) Most seeds (e.g., sunflower, flax, sesame/sesame tahini) Oily fish (e.g., salmon, bluefish, mackerel, tuna,  sardines)

## 2023-06-27 NOTE — Telephone Encounter (Signed)
Medication Samples have been provided to the patient.  Drug name: Dexcom G7 Sensor       Strength: 0        Qty: 1  LOT: [id number]  Exp.Date: 09/18/2024  Dosing instructions: Apply 1 sensor every 3 days   The patient has been instructed regarding the correct time, dose, and frequency of taking this medication, including desired effects and most common side effects.   Leota Sauers, CMA 9:27 AM 06/27/2023

## 2023-06-27 NOTE — Progress Notes (Signed)
Outpatient Endocrinology Note Gabriel Coryell, MD    DOMIQUE SLIMAN Mar 30, 1976 [id number]  Referring Provider: Melida Quitter, MD Primary Care Provider: Melida Quitter, MD Reason for consultation: Subjective   Assessment & Plan  Diagnoses and all orders for this visit:  Hypoglycemia -     Sulfonylurea Hypoglycemics Panel, blood -     Basic metabolic panel -     Insulin, random; Future -     Insulin antibodies, blood; Future -     C-peptide -     Beta-hydroxybutyric acid -     Proinsulin -     Cortisol -     Glucagon -     Insulin-like growth factor; Future -     Continuous Glucose Sensor (DEXCOM G7 SENSOR) MISC; Use to check blood sugar continuously -     Blood Glucose Monitoring Suppl (BLOOD GLUCOSE MONITOR SYSTEM) w/Device KIT; Use to test blood sugar in the morning, at noon, and at bedtime. -     Glucose Blood (BLOOD GLUCOSE TEST STRIPS) STRP; Use to test blood sugar in the morning, at noon, and at bedtime. -     Lancet Device MISC; 1 each by Does not apply route in the morning, at noon, and at bedtime. May substitute to any manufacturer covered by patient's insurance. -     Lancets (FREESTYLE) lancets; Use to test blood sugar in the morning, at noon, and at bedtime.    Patient presents to rule out hypoglycemia as etiology of symptoms No low blood sugars found in the record, but patient reported having history of 1 in the past C/o periods of lightheadedness, shakiness, weakness and dizziness for the past 12 years. Often correlated with food consumption (fast food and heavier meal or could be late morning fasting)  Seen an endocrinologist 10 years ago and was given a BG meter but patient was never able to reproduce it with low blood sugars Patient given a meter with strips to check blood sugars when patient has symptoms Patient given a sample of CGM to return back in 10 days for download Also ordered CGM to the pharmacy for patient's for continuity of care Ordered labs to  obtain when the blood sugar is less than 50-55, if patient happens to be in the hospital/lab at that time Patient to go to ER if blood sugar drops for further evaluation Discussed dietary changes at length: Incorporating small frequent meals as well as complex carbs/protein diet and snack Detailed dietary handout provided   Return in about 8 weeks (around 08/22/2023).   I have reviewed current medications, nurse's notes, allergies, vital signs, past medical and surgical history, family medical history, and social history for this encounter. Counseled patient on symptoms, examination findings, lab findings, imaging results, treatment decisions and monitoring and prognosis. The patient understood the recommendations and agrees with the treatment plan. All questions regarding treatment plan were fully answered.  Gabriel Sparta, MD  06/27/23   History of Present Illness HPI  Gabriel Brooks is a 47 y.o. year old male who presents for evaluation of hypoglycemia like symptoms.  C/o periods of lightheadedness, shakiness, weakness and dizziness for the past 12 years. Often correlated with food consumption (fast food and heavier meal or also fasting) and time of day.   Seen an endocrinologist 10 years ago and was given a BG meter but patient was never able to reproduce it with low blood sugars.  -The patient's medication and drug history was reviewed carefully for potential  causes of hypoglycemia. -No history of insulin usage or ingestion of an oral hypoglycemic agent -No history of diabetes mellitus, -No history of renal insufficiency/failure, -No history of alcoholism -No history of  hepatic cirrhosis/failure, -No history of other endocrine diseases -History of total gastrectomy + for stomach cancer -History of cardiac arrythmia  -No history of endocrine disorders (eg, pheochromocytoma, Addison disease, glucagon deficiency, carcinomas, extrahepatic tumors) -No history of substance abuse (eg,  cocaine, ethanol, salicylates, beta-blockers, pentamidine) -No history of nutritional disorders (eg, prolonged starvation before anesthesia, protein calorie malnutrition, L-leucine-sensitive hypoglycemic defect in children, low-calorie ketogenic diet, renal disease)   Physical Exam  BP 115/85   Pulse 65   Ht 5\' 7"  (1.702 m)   Wt 152 lb 3.2 oz (69 kg)   SpO2 98%   BMI 23.84 kg/m    Constitutional: well developed, well nourished Head: normocephalic, atraumatic Eyes: sclera anicteric, no redness Neck: supple Lungs: normal respiratory effort Neurology: alert and oriented Skin: dry, no appreciable rashes Musculoskeletal: no appreciable defects Psychiatric: normal mood and affect   Current Medications Patient's Medications  New Prescriptions   BLOOD GLUCOSE MONITORING SUPPL (BLOOD GLUCOSE MONITOR SYSTEM) W/DEVICE KIT    Use to test blood sugar in the morning, at noon, and at bedtime.   CONTINUOUS GLUCOSE SENSOR (DEXCOM G7 SENSOR) MISC    Use to check blood sugar continuously   GLUCOSE BLOOD (BLOOD GLUCOSE TEST STRIPS) STRP    Use to test blood sugar in the morning, at noon, and at bedtime.   LANCET DEVICE MISC    1 each by Does not apply route in the morning, at noon, and at bedtime. May substitute to any manufacturer covered by patient's insurance.   LANCETS (FREESTYLE) LANCETS    Use to test blood sugar in the morning, at noon, and at bedtime.  Previous Medications   AMPHETAMINE-DEXTROAMPHETAMINE (ADDERALL) 5 MG TABLET    Take 1 tablet  by mouth daily.   SODIUM FLUORIDE (PREVIDENT 5000 DRY MOUTH) 1.1 % GEL DENTAL GEL    Brush on teeth 2 times a day for 2 mins each time, expectorate as much as possible then DO NOT swish, eat, or drink for 30 minutes  Modified Medications   No medications on file  Discontinued Medications   BETAMETHASONE DIPROPIONATE 0.05 % CREAM    Apply a thin layer on affected areas 2 times a day for 3 weeks then repeat as needed   COVID-19 AT HOME ANTIGEN TEST  (CARESTART COVID-19 HOME TEST) KIT    Use as directed within package instructions   FLUCONAZOLE (DIFLUCAN) 200 MG TABLET    Take 2 tablets bymouth now and repeat in 1 week.   FLUCONAZOLE (DIFLUCAN) 200 MG TABLET    Take 2 tablets now, repeat in 1 week.   SODIUM FLUORIDE (PREVIDENT 5000 DRY MOUTH) 1.1 % GEL DENTAL GEL    Brush on teeth 2 times a day for 2 mins each time, spit out as much as possible then DO NOT swish, eat, or drink for    Allergies Allergies  Allergen Reactions   Penicillins     Unknown childhood reaction    Past Medical History Past Medical History:  Diagnosis Date   Viral myocarditis 1997    Past Surgical History Past Surgical History:  Procedure Laterality Date   FEMUR SURGERY  2007    Family History family history includes Esophageal cancer (age of onset: 30 - 35) in his father.  Social History Social History   Socioeconomic History  Marital status: Married    Spouse name: Not on file   Number of children: Not on file   Years of education: Not on file   Highest education level: Not on file  Occupational History   Not on file  Tobacco Use   Smoking status: Never   Smokeless tobacco: Never  Vaping Use   Vaping status: Never Used  Substance and Sexual Activity   Alcohol use: Yes   Drug use: No   Sexual activity: Not on file  Other Topics Concern   Not on file  Social History Narrative   Not on file   Social Determinants of Health   Financial Resource Strain: Not on file  Food Insecurity: Not on file  Transportation Needs: Not on file  Physical Activity: Not on file  Stress: Not on file  Social Connections: Not on file  Intimate Partner Violence: Not on file    No results found for: "CHOL" No results found for: "HDL" No results found for: "LDLCALC" No results found for: "TRIG" No results found for: "CHOLHDL" Lab Results  Component Value Date   CREATININE 1.24 03/25/2021   No results found for: "GFR"    Component Value  Date/Time   NA 138 03/25/2021 0855   K 3.9 03/25/2021 0855   CL 105 03/25/2021 0855   CO2 28 03/25/2021 0855   GLUCOSE 110 (H) 03/25/2021 0855   BUN 15 03/25/2021 0855   CREATININE 1.24 03/25/2021 0855   CALCIUM 9.5 03/25/2021 0855   PROT 7.5 03/25/2021 0855   ALBUMIN 4.2 03/25/2021 0855   AST 22 03/25/2021 0855   ALT 26 03/25/2021 0855   ALKPHOS 42 03/25/2021 0855   BILITOT 0.5 03/25/2021 0855   GFRNONAA >60 03/25/2021 0855   GFRAA >60 06/16/2020 1548      Latest Ref Rng & Units 03/25/2021    8:55 AM 06/16/2020    3:48 PM 11/05/2011    2:19 PM  BMP  Glucose 70 - 99 mg/dL 213  086  84   BUN 6 - 20 mg/dL 15  16  11    Creatinine 0.61 - 1.24 mg/dL 5.78  4.69  6.29   Sodium 135 - 145 mmol/L 138  140  139   Potassium 3.5 - 5.1 mmol/L 3.9  3.5  4.1   Chloride 98 - 111 mmol/L 105  108  103   CO2 22 - 32 mmol/L 28  22  27    Calcium 8.9 - 10.3 mg/dL 9.5  9.0  9.8        Component Value Date/Time   WBC 6.4 03/25/2021 0855   RBC 4.90 03/25/2021 0855   HGB 14.3 03/25/2021 0855   HCT 43.4 03/25/2021 0855   PLT 311 03/25/2021 0855   MCV 88.6 03/25/2021 0855   MCH 29.2 03/25/2021 0855   MCHC 32.9 03/25/2021 0855   RDW 12.9 03/25/2021 0855   LYMPHSABS 1.6 03/25/2021 0855   MONOABS 0.4 03/25/2021 0855   EOSABS 0.1 03/25/2021 0855   BASOSABS 0.0 03/25/2021 0855   No results found for: "TSH", "FREET4"       Parts of this note may have been dictated using voice recognition software. There may be variances in spelling and vocabulary which are unintentional. Not all errors are proofread. Please notify the Thereasa Parkin if any discrepancies are noted or if the meaning of any statement is not clear.

## 2023-06-29 ENCOUNTER — Other Ambulatory Visit (HOSPITAL_COMMUNITY): Payer: Self-pay

## 2023-07-05 ENCOUNTER — Other Ambulatory Visit (HOSPITAL_COMMUNITY): Payer: Self-pay

## 2023-07-06 ENCOUNTER — Other Ambulatory Visit (HOSPITAL_COMMUNITY): Payer: Self-pay

## 2023-07-12 ENCOUNTER — Ambulatory Visit: Payer: 59 | Admitting: "Endocrinology

## 2023-09-04 ENCOUNTER — Other Ambulatory Visit: Payer: Self-pay | Admitting: Adult Health

## 2023-09-04 DIAGNOSIS — F909 Attention-deficit hyperactivity disorder, unspecified type: Secondary | ICD-10-CM

## 2023-09-05 NOTE — Telephone Encounter (Signed)
Due 10/19

## 2023-09-06 ENCOUNTER — Other Ambulatory Visit (HOSPITAL_COMMUNITY): Payer: Self-pay

## 2023-09-06 MED ORDER — AMPHETAMINE-DEXTROAMPHETAMINE 5 MG PO TABS
5.0000 mg | ORAL_TABLET | Freq: Every day | ORAL | 0 refills | Status: DC
  Filled 2023-09-07: qty 90, 90d supply, fill #0

## 2023-09-07 ENCOUNTER — Other Ambulatory Visit (HOSPITAL_COMMUNITY): Payer: Self-pay

## 2023-09-10 ENCOUNTER — Other Ambulatory Visit (HOSPITAL_COMMUNITY): Payer: Self-pay

## 2023-09-12 ENCOUNTER — Other Ambulatory Visit: Payer: Self-pay

## 2023-09-12 ENCOUNTER — Other Ambulatory Visit (HOSPITAL_COMMUNITY): Payer: Self-pay

## 2023-09-13 ENCOUNTER — Other Ambulatory Visit (HOSPITAL_COMMUNITY): Payer: Self-pay

## 2023-10-08 ENCOUNTER — Other Ambulatory Visit (HOSPITAL_COMMUNITY): Payer: Self-pay

## 2023-10-08 ENCOUNTER — Telehealth: Payer: Self-pay

## 2023-10-08 NOTE — Telephone Encounter (Signed)
Pharmacy Patient Advocate Encounter   Received notification from CoverMyMeds that prior authorization for Dexcom G7 Sensor is required/requested.   Insurance verification completed.   The patient is insured through [facility] .   Per test claim:  This item will not be covered without supporting labs or documentation of the following: 2 or more severe hyperglycemic attacks, history of, and current use, of insulin, testing 4 or more times per day

## 2023-12-09 ENCOUNTER — Other Ambulatory Visit: Payer: Self-pay | Admitting: Adult Health

## 2023-12-09 DIAGNOSIS — F909 Attention-deficit hyperactivity disorder, unspecified type: Secondary | ICD-10-CM

## 2023-12-10 NOTE — Telephone Encounter (Signed)
Please schedule pt an appt. Lv 06/6 due back in 6 months.

## 2023-12-11 MED ORDER — AMPHETAMINE-DEXTROAMPHETAMINE 5 MG PO TABS
5.0000 mg | ORAL_TABLET | Freq: Every day | ORAL | 0 refills | Status: DC
Start: 2023-12-11 — End: 2024-03-26
  Filled 2023-12-11: qty 90, 90d supply, fill #0

## 2023-12-11 NOTE — Telephone Encounter (Signed)
Lvm for pt to call and schedule

## 2023-12-12 ENCOUNTER — Other Ambulatory Visit (HOSPITAL_COMMUNITY): Payer: Self-pay

## 2023-12-23 ENCOUNTER — Telehealth: Payer: 59 | Admitting: Adult Health

## 2023-12-23 ENCOUNTER — Encounter: Payer: Self-pay | Admitting: Adult Health

## 2023-12-23 DIAGNOSIS — F909 Attention-deficit hyperactivity disorder, unspecified type: Secondary | ICD-10-CM | POA: Diagnosis not present

## 2023-12-23 NOTE — Progress Notes (Signed)
Gabriel Brooks [id number] 11/11/1976 48 y.o.  Virtual Visit via Video Note  I connected with pt @ on 12/23/23 at  3:00 PM EST by a video enabled telemedicine application and verified that I am speaking with the correct person using two identifiers.   I discussed the limitations of evaluation and management by telemedicine and the availability of in person appointments. The patient expressed understanding and agreed to proceed.  I discussed the assessment and treatment plan with the patient. The patient was provided an opportunity to ask questions and all were answered. The patient agreed with the plan and demonstrated an understanding of the instructions.   The patient was advised to call back or seek an in-person evaluation if the symptoms worsen or if the condition fails to improve as anticipated.  I provided 10 minutes of non-face-to-face time during this encounter.  The patient was located at home.  The provider was located at [facility].   Dorothyann Gibbs, NP   Subjective:   Patient ID:  Gabriel Brooks is a 48 y.o. (DOB [birth year]) male.  Chief Complaint: No chief complaint on file.   HPI NICKALOS PETERSEN presents for follow-up of ADHD.  Describes mood today as "ok". Pleasant. Mood symptoms - denies depression, anxiety, and irritability. Reports stable interest and motivation. Denies panic attacks. Denies worry, rumination and over thinking. Mood is consistent. Stating "I feel like I'm doing good". Feels like Adderall continues to work well for him. Taking medications as prescribed.  Energy levels stable. Active, has a regular exercise routine. Enjoys some usual interests and activities. Married. Lives with wife and 2 children. Family local. Spending time with family. Appetite adequate. Weight loss - 148 from 160 pounds - 67". Sleeps well most nights. Averages 7 hours. Focus and concentration stable - taking Adderall 5mg  daily. Completing tasks. Managing aspects of  household. 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  Denies AH or VH. Denies self harm. Denies substance use.  Previous medication trials: Denies   Review of Systems:  Review of Systems  Musculoskeletal:  Negative for gait problem.  Neurological:  Negative for tremors.  Psychiatric/Behavioral:         Please refer to HPI    Medications: I have reviewed the patient's current medications.  Current Outpatient Medications  Medication Sig Dispense Refill   amphetamine-dextroamphetamine (ADDERALL) 5 MG tablet Take 1 tablet (5 mg total) by mouth daily. 90 tablet 0   Blood Glucose Monitoring Suppl (BLOOD GLUCOSE MONITOR SYSTEM) w/Device KIT Use to test blood sugar in the morning, at noon, and at bedtime. 1 kit 0   Continuous Glucose Sensor (DEXCOM G7 SENSOR) MISC Use to check blood sugar continuously 9 each 0   sodium fluoride (PREVIDENT 5000 DRY MOUTH) 1.1 % GEL dental gel Brush on teeth 2 times a day for 2 mins each time, expectorate as much as possible then DO NOT swish, eat, or drink for 30 minutes 56 g 5   No current facility-administered medications for this visit.    Medication Side Effects: None  Allergies:  Allergies  Allergen Reactions   Penicillins     Unknown childhood reaction    Past Medical History:  Diagnosis Date   Viral myocarditis 61    Family History  Problem Relation Age of Onset   Esophageal cancer Father 17 - 40       Pt dad had a tumor removed from his throat not sure if it was cancer  but thinks it might be   Colon cancer Neg Hx    Colon polyps Neg Hx    Prostate cancer Neg Hx    Rectal cancer Neg Hx     Social History   Socioeconomic History   Marital status: Married    Spouse name: Not on file   Number of children: Not on file   Years of education: Not on file   Highest education level: Not on file  Occupational History   Not on file  Tobacco Use   Smoking status: Never   Smokeless tobacco: Never   Vaping Use   Vaping status: Never Used  Substance and Sexual Activity   Alcohol use: Yes   Drug use: No   Sexual activity: Not on file  Other Topics Concern   Not on file  Social History Narrative   Not on file   Social Drivers of Health   Financial Resource Strain: Not on file  Food Insecurity: Not on file  Transportation Needs: Not on file  Physical Activity: Not on file  Stress: Not on file  Social Connections: Not on file  Intimate Partner Violence: Not on file    Past Medical History, Surgical history, Social history, and Family history were reviewed and updated as appropriate.   Please see review of systems for further details on the patient's review from today.   Objective:   Physical Exam:  There were no vitals taken for this visit.  Physical Exam Constitutional:      General: He is not in acute distress. Musculoskeletal:        General: No deformity.  Neurological:     Mental Status: He is alert and oriented to person, place, and time.     Coordination: Coordination normal.  Psychiatric:        Attention and Perception: Attention and perception normal. He does not perceive auditory or visual hallucinations.        Mood and Affect: Mood normal. Mood is not anxious or depressed. Affect is not labile, blunt, angry or inappropriate.        Speech: Speech normal.        Behavior: Behavior normal.        Thought Content: Thought content normal. Thought content is not paranoid or delusional. Thought content does not include homicidal or suicidal ideation. Thought content does not include homicidal or suicidal plan.        Cognition and Memory: Cognition and memory normal.        Judgment: Judgment normal.     Comments: Insight intact     Lab Review:     Component Value Date/Time   NA 138 03/25/2021 0855   K 3.9 03/25/2021 0855   CL 105 03/25/2021 0855   CO2 28 03/25/2021 0855   GLUCOSE 110 (H) 03/25/2021 0855   BUN 15 03/25/2021 0855   CREATININE 1.24  03/25/2021 0855   CALCIUM 9.5 03/25/2021 0855   PROT 7.5 03/25/2021 0855   ALBUMIN 4.2 03/25/2021 0855   AST 22 03/25/2021 0855   ALT 26 03/25/2021 0855   ALKPHOS 42 03/25/2021 0855   BILITOT 0.5 03/25/2021 0855   GFRNONAA >60 03/25/2021 0855   GFRAA >60 06/16/2020 1548       Component Value Date/Time   WBC 6.4 03/25/2021 0855   RBC 4.90 03/25/2021 0855   HGB 14.3 03/25/2021 0855   HCT 43.4 03/25/2021 0855   PLT 311 03/25/2021 0855   MCV 88.6 03/25/2021 0855   MCH  29.2 03/25/2021 0855   MCHC 32.9 03/25/2021 0855   RDW 12.9 03/25/2021 0855   LYMPHSABS 1.6 03/25/2021 0855   MONOABS 0.4 03/25/2021 0855   EOSABS 0.1 03/25/2021 0855   BASOSABS 0.0 03/25/2021 0855    No results found for: "POCLITH", "LITHIUM"   No results found for: "PHENYTOIN", "PHENOBARB", "VALPROATE", "CBMZ"   .res Assessment: Plan:    Plan:  PDMP reviewed  1. Adderall 5mg  daily - will call in 3 months for next 3 months refill  Monitor BP between visits while taking stimulant medication.   RTC 6 months  10 minutes spent dedicated to the care of this patient on the date of this encounter to include pre-visit review of records, ordering of medication, post visit documentation, and face-to-face time with the patient discussing ADD. Discussed continuing current medication regimen.  Patient advised to contact office with any questions, adverse effects, or acute worsening in signs and symptoms.  Discussed potential benefits, risks, and side effects of stimulants with patient to include increased heart rate, palpitations, insomnia, increased anxiety, increased irritability, or decreased appetite.  Instructed patient to contact office if experiencing any significant tolerability issues.  There are no diagnoses linked to this encounter.   Please see After Visit Summary for patient specific instructions.  No future appointments.  No orders of the defined types were placed in this encounter.      -------------------------------

## 2024-01-21 ENCOUNTER — Other Ambulatory Visit: Payer: Self-pay

## 2024-02-28 DIAGNOSIS — H5203 Hypermetropia, bilateral: Secondary | ICD-10-CM | POA: Diagnosis not present

## 2024-03-26 ENCOUNTER — Other Ambulatory Visit (HOSPITAL_COMMUNITY): Payer: Self-pay

## 2024-03-26 ENCOUNTER — Other Ambulatory Visit: Payer: Self-pay | Admitting: Adult Health

## 2024-03-26 DIAGNOSIS — F909 Attention-deficit hyperactivity disorder, unspecified type: Secondary | ICD-10-CM

## 2024-03-26 MED ORDER — AMPHETAMINE-DEXTROAMPHETAMINE 5 MG PO TABS
5.0000 mg | ORAL_TABLET | Freq: Every day | ORAL | 0 refills | Status: DC
Start: 2024-03-26 — End: 2024-07-06
  Filled 2024-03-26: qty 90, 90d supply, fill #0

## 2024-06-19 DIAGNOSIS — H16143 Punctate keratitis, bilateral: Secondary | ICD-10-CM | POA: Diagnosis not present

## 2024-06-24 ENCOUNTER — Other Ambulatory Visit: Payer: Self-pay | Admitting: Adult Health

## 2024-06-24 DIAGNOSIS — F909 Attention-deficit hyperactivity disorder, unspecified type: Secondary | ICD-10-CM

## 2024-06-24 NOTE — Telephone Encounter (Signed)
 Due for FU now. Sent MyChart message asking him to schedule an appt.

## 2024-06-26 NOTE — Telephone Encounter (Signed)
 Please call to schedule FU. Did not respond to MyChart message.

## 2024-07-02 ENCOUNTER — Other Ambulatory Visit (HOSPITAL_COMMUNITY): Payer: Self-pay

## 2024-07-03 ENCOUNTER — Other Ambulatory Visit (HOSPITAL_COMMUNITY): Payer: Self-pay

## 2024-07-03 ENCOUNTER — Encounter (HOSPITAL_COMMUNITY): Payer: Self-pay | Admitting: Pharmacist

## 2024-07-06 ENCOUNTER — Ambulatory Visit (INDEPENDENT_AMBULATORY_CARE_PROVIDER_SITE_OTHER): Admitting: Adult Health

## 2024-07-06 ENCOUNTER — Encounter: Payer: Self-pay | Admitting: Adult Health

## 2024-07-06 ENCOUNTER — Other Ambulatory Visit (HOSPITAL_COMMUNITY): Payer: Self-pay

## 2024-07-06 DIAGNOSIS — F909 Attention-deficit hyperactivity disorder, unspecified type: Secondary | ICD-10-CM | POA: Diagnosis not present

## 2024-07-06 MED ORDER — AMPHETAMINE-DEXTROAMPHETAMINE 5 MG PO TABS
5.0000 mg | ORAL_TABLET | Freq: Every day | ORAL | 0 refills | Status: DC
  Filled 2024-07-06: qty 90, 90d supply, fill #0

## 2024-07-06 NOTE — Progress Notes (Signed)
 Gabriel Brooks [id number] [birth year] 48 y.o.  Subjective:   Patient ID:  Gabriel Brooks is a 48 y.o. (DOB 05/01/1976) male.  Chief Complaint: No chief complaint on file.   HPI DORELL GATLIN presents to the office today for follow-up of ADHD.  Describes mood today as ok. Pleasant. Mood symptoms - denies depression, anxiety and irritability. Reports stable interest and motivation. Denies panic attacks. Denies worry, rumination and over thinking. Reports mood is consistent. Stating I feel like I'm doing pretty good. Feels like Adderall continues to work well for him. Taking medications as prescribed.  Energy levels stable. Active, has a regular exercise routine. Enjoys some usual interests and activities. Married. Lives with wife and 2 children. Family local. Spending time with family. Appetite adequate. Weight today - 153 pounds - 67. Sleeps well most nights. Averages 7 hours. Focus and concentration stable - taking Adderall 5mg  daily. Completing tasks. Managing aspects of household. 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  Denies AH or VH. Denies self harm. Denies substance use.  Previous medication trials: Denies   Flowsheet Row ED from 03/25/2021 in [facility]  C-SSRS RISK CATEGORY No Risk     Review of Systems:  Review of Systems  Musculoskeletal:  Negative for gait problem.  Neurological:  Negative for tremors.  Psychiatric/Behavioral:         Please refer to HPI    Medications: I have reviewed the patient's current medications.  Current Outpatient Medications  Medication Sig Dispense Refill   amphetamine -dextroamphetamine  (ADDERALL) 5 MG tablet Take 1 tablet (5 mg total) by mouth daily. 90 tablet 0   Blood Glucose Monitoring Suppl (BLOOD GLUCOSE MONITOR SYSTEM) w/Device KIT Use to test blood sugar in the morning, at noon, and at bedtime. 1 kit 0   Continuous Glucose Sensor (DEXCOM G7 SENSOR)  MISC Use to check blood sugar continuously 9 each 0   sodium fluoride  (PREVIDENT 5000 DRY MOUTH) 1.1 % GEL dental gel Brush on teeth 2 times a day for 2 mins each time, expectorate as much as possible then DO NOT swish, eat, or drink for 30 minutes 56 g 5   No current facility-administered medications for this visit.    Medication Side Effects: None  Allergies:  Allergies  Allergen Reactions   Penicillins     Unknown childhood reaction    Past Medical History:  Diagnosis Date   Viral myocarditis 1997    Past Medical History, Surgical history, Social history, and Family history were reviewed and updated as appropriate.   Please see review of systems for further details on the patient's review from today.   Objective:   Physical Exam:  There were no vitals taken for this visit.  Physical Exam Constitutional:      General: He is not in acute distress. Musculoskeletal:        General: No deformity.  Neurological:     Mental Status: He is alert and oriented to person, place, and time.     Coordination: Coordination normal.  Psychiatric:        Attention and Perception: Attention and perception normal. He does not perceive auditory or visual hallucinations.        Mood and Affect: Mood normal. Mood is not anxious or depressed. Affect is not labile, blunt, angry or inappropriate.        Speech: Speech normal.        Behavior: Behavior normal.  Thought Content: Thought content normal. Thought content is not paranoid or delusional. Thought content does not include homicidal or suicidal ideation. Thought content does not include homicidal or suicidal plan.        Cognition and Memory: Cognition and memory normal.        Judgment: Judgment normal.     Comments: Insight intact     Lab Review:     Component Value Date/Time   NA 138 03/25/2021 0855   K 3.9 03/25/2021 0855   CL 105 03/25/2021 0855   CO2 28 03/25/2021 0855   GLUCOSE 110 (H) 03/25/2021 0855   BUN 15  03/25/2021 0855   CREATININE 1.24 03/25/2021 0855   CALCIUM 9.5 03/25/2021 0855   PROT 7.5 03/25/2021 0855   ALBUMIN 4.2 03/25/2021 0855   AST 22 03/25/2021 0855   ALT 26 03/25/2021 0855   ALKPHOS 42 03/25/2021 0855   BILITOT 0.5 03/25/2021 0855   GFRNONAA >60 03/25/2021 0855   GFRAA >60 06/16/2020 1548       Component Value Date/Time   WBC 6.4 03/25/2021 0855   RBC 4.90 03/25/2021 0855   HGB 14.3 03/25/2021 0855   HCT 43.4 03/25/2021 0855   PLT 311 03/25/2021 0855   MCV 88.6 03/25/2021 0855   MCH 29.2 03/25/2021 0855   MCHC 32.9 03/25/2021 0855   RDW 12.9 03/25/2021 0855   LYMPHSABS 1.6 03/25/2021 0855   MONOABS 0.4 03/25/2021 0855   EOSABS 0.1 03/25/2021 0855   BASOSABS 0.0 03/25/2021 0855    No results found for: POCLITH, LITHIUM   No results found for: PHENYTOIN, PHENOBARB, VALPROATE, CBMZ   .res Assessment: Plan:    Plan:  PDMP reviewed  1. Adderall 5mg  daily - will call in 3 months for next 3 months refill  Monitor BP between visits while taking stimulant medication.   RTC 6 months  10 minutes spent dedicated to the care of this patient on the date of this encounter to include pre-visit review of records, ordering of medication, post visit documentation, and face-to-face time with the patient discussing ADD. Discussed continuing current medication regimen.  Patient advised to contact office with any questions, adverse effects, or acute worsening in signs and symptoms.  Discussed potential benefits, risks, and side effects of stimulants with patient to include increased heart rate, palpitations, insomnia, increased anxiety, increased irritability, or decreased appetite.  Instructed patient to contact office if experiencing any significant tolerability issues.  There are no diagnoses linked to this encounter.   Please see After Visit Summary for patient specific instructions.  Future Appointments  Date Time Provider Department Center   07/06/2024  8:00 AM Gursimran Litaker Nattalie, NP [facility] None    No orders of the defined types were placed in this encounter.   -------------------------------

## 2024-10-04 ENCOUNTER — Other Ambulatory Visit: Payer: Self-pay | Admitting: Adult Health

## 2024-10-04 DIAGNOSIS — F909 Attention-deficit hyperactivity disorder, unspecified type: Secondary | ICD-10-CM

## 2024-10-05 ENCOUNTER — Other Ambulatory Visit: Payer: Self-pay

## 2024-10-05 ENCOUNTER — Other Ambulatory Visit (HOSPITAL_COMMUNITY): Payer: Self-pay

## 2024-10-05 MED ORDER — AMPHETAMINE-DEXTROAMPHETAMINE 5 MG PO TABS
5.0000 mg | ORAL_TABLET | Freq: Every day | ORAL | 0 refills | Status: AC
  Filled 2024-10-05: qty 90, 90d supply, fill #0

## 2025-01-04 ENCOUNTER — Ambulatory Visit: Admitting: Adult Health
